# Patient Record
Sex: Male | Born: 1940 | Race: White | Hispanic: No | Marital: Married | Smoking: Former smoker
Health system: Southern US, Community
[De-identification: ages and names within clinical notes are randomized; demographics above are authoritative.]

## PROBLEM LIST (undated history)

## (undated) DIAGNOSIS — Z8546 Personal history of malignant neoplasm of prostate: Secondary | ICD-10-CM

## (undated) DIAGNOSIS — D471 Chronic myeloproliferative disease: Secondary | ICD-10-CM

## (undated) DIAGNOSIS — C801 Malignant (primary) neoplasm, unspecified: Secondary | ICD-10-CM

## (undated) DIAGNOSIS — E785 Hyperlipidemia, unspecified: Secondary | ICD-10-CM

## (undated) HISTORY — DX: Personal history of malignant neoplasm of prostate: Z85.46

## (undated) HISTORY — DX: Chronic myeloproliferative disease: D47.1

## (undated) HISTORY — DX: Hyperlipidemia, unspecified: E78.5

---

## 2015-04-29 HISTORY — PX: PROSTATECTOMY: SHX69

## 2018-10-12 ENCOUNTER — Encounter (HOSPITAL_COMMUNITY): Payer: Self-pay

## 2018-10-12 ENCOUNTER — Other Ambulatory Visit: Payer: Self-pay

## 2018-10-12 ENCOUNTER — Emergency Department (HOSPITAL_COMMUNITY)
Admission: EM | Admit: 2018-10-12 | Discharge: 2018-10-12 | Disposition: A | Discharge status: Home / Self Care | Payer: Medicare PPO | Attending: Emergency Medicine | Admitting: Emergency Medicine

## 2018-10-12 ENCOUNTER — Emergency Department (HOSPITAL_COMMUNITY): Payer: Medicare PPO

## 2018-10-12 DIAGNOSIS — R109 Unspecified abdominal pain: Secondary | ICD-10-CM | POA: Diagnosis not present

## 2018-10-12 DIAGNOSIS — N390 Urinary tract infection, site not specified: Secondary | ICD-10-CM

## 2018-10-12 LAB — URINALYSIS, ROUTINE W REFLEX MICROSCOPIC
Bacteria, UA: NONE SEEN
Bilirubin Urine: NEGATIVE
Glucose, UA: NEGATIVE mg/dL
Ketones, ur: NEGATIVE mg/dL
Nitrite: NEGATIVE
Protein, ur: 30 mg/dL — AB
Specific Gravity, Urine: 1.009 (ref 1.005–1.030)
pH: 6 (ref 5.0–8.0)

## 2018-10-12 MED ORDER — OXYCODONE-ACETAMINOPHEN 5-325 MG PO TABS: 1.0000 | ORAL_TABLET | ORAL | 0 refills | Status: DC | PRN

## 2018-10-12 MED ORDER — CEPHALEXIN 500 MG PO CAPS: 500.0000 mg | ORAL_CAPSULE | Freq: Three times a day (TID) | ORAL | 0 refills | Status: DC

## 2018-10-12 MED ORDER — OXYCODONE-ACETAMINOPHEN 5-325 MG PO TABS
2.0000 | ORAL_TABLET | Freq: Once | ORAL | Status: AC
  Administered 2018-10-12: 2 via ORAL
  Filled 2018-10-12: qty 2

## 2018-10-12 NOTE — ED Provider Notes (Signed)
[facility] EMERGENCY DEPARTMENT Provider Note   CSN: [id number] Arrival date & time: 10/12/18  0701     History   Chief Complaint Chief Complaint  Patient presents with  . Nephrolithiasis    HPI Gordon Love is a 78 y.o. male.     78 year old male recently diagnosed with 4 mm right-sided kidney stone presents with worsening pain.  States he took Toradol a few days ago and felt strange afterwards.  Was pain-free for 2 days but then developed colicky pain this morning.  Denies any hematuria or dysuria.  No fever or chills.  Requesting different for pain.     History reviewed. No pertinent past medical history.  There are no active problems to display for this patient.   History reviewed. No pertinent surgical history.      Home Medications    Prior to Admission medications   Not on File    Family History No family history on file.  Social History Social History   Tobacco Use  . Smoking status: Not on file  Substance Use Topics  . Alcohol use: Not on file  . Drug use: Not on file     Allergies   Patient has no allergy information on record.   Review of Systems Review of Systems  All other systems reviewed and are negative.    Physical Exam Updated Vital Signs BP 136/88   Pulse (!) 106   Temp 98.1 F (36.7 C) (Oral)   Resp 16   Ht 1.702 m (5\' 7" )   Wt 75.3 kg   SpO2 94%   BMI 26.00 kg/m   Physical Exam Vitals signs and nursing note reviewed.  Constitutional:      General: He is not in acute distress.    Appearance: Normal appearance. He is well-developed. He is not toxic-appearing.  HENT:     Head: Normocephalic and atraumatic.  Eyes:     General: Lids are normal.     Conjunctiva/sclera: Conjunctivae normal.     Pupils: Pupils are equal, round, and reactive to light.  Neck:     Musculoskeletal: Normal range of motion and neck supple.     Thyroid: No thyroid mass.     Trachea: No tracheal deviation.   Cardiovascular:     Rate and Rhythm: Normal rate and regular rhythm.     Heart sounds: Normal heart sounds. No murmur. No gallop.   Pulmonary:     Effort: Pulmonary effort is normal. No respiratory distress.     Breath sounds: Normal breath sounds. No stridor. No decreased breath sounds, wheezing, rhonchi or rales.  Abdominal:     General: Bowel sounds are normal. There is no distension.     Palpations: Abdomen is soft.     Tenderness: There is no abdominal tenderness. There is no rebound.  Musculoskeletal: Normal range of motion.        General: No tenderness.  Skin:    General: Skin is warm and dry.     Findings: No abrasion or rash.  Neurological:     Mental Status: He is alert and oriented to person, place, and time.     GCS: GCS eye subscore is 4. GCS verbal subscore is 5. GCS motor subscore is 6.     Cranial Nerves: No cranial nerve deficit.     Sensory: No sensory deficit.  Psychiatric:        Speech: Speech normal.        Behavior: Behavior normal.  ED Treatments / Results  Labs (all labs ordered are listed, but only abnormal results are displayed) Labs Reviewed  URINE CULTURE  URINALYSIS, [place] MICROSCOPIC    EKG    Radiology No results found.  Procedures Procedures (including critical care time)  Medications Ordered in ED Medications  oxyCODONE-acetaminophen (PERCOCET/ROXICET) 5-325 MG per tablet 2 tablet (has no administration in time range)     Initial Impression / Assessment and Plan / ED Course  I have reviewed the triage vital signs and the nursing notes.  Pertinent labs & imaging results that were available during my care of the patient were reviewed by me and considered in my medical decision making (see chart for details).        Patient given 2 Percocets for pain here and feels better.  Urinalysis does show some white blood cells.  He is afebrile here.  Will treat for UTI and he will follow-up with his urologist  Final  Clinical Impressions(s) / ED Diagnoses   Final diagnoses:  None    ED Discharge Orders    None       Lacretia Leigh, MD 10/12/18 [phone]

## 2018-10-12 NOTE — ED Notes (Signed)
Patient drinking water and had a small clear emesis. Stated did not eat or drink anything this morning and had pain medication 2 tabs that might of upset his stomach. Denies nausea now refused nausea medication given crackers tolerated without incident.

## 2018-10-12 NOTE — ED Notes (Signed)
ED Provider at bedside. 

## 2018-10-12 NOTE — ED Notes (Signed)
DaughterRichardean Chimera- [phone]

## 2018-10-12 NOTE — ED Notes (Signed)
Pt given dc instructions and pt verbalizes understanding. Pt left with wife and daughter.

## 2018-10-12 NOTE — Discharge Instructions (Addendum)
Stop taking the Toradol and follow-up with your doctor as needed.  Your kidney stone has made some progression but is still present.

## 2018-10-12 NOTE — ED Triage Notes (Signed)
Pt reports right sided flank pain, went to a hospital in [place], dx with kidney stones, given prescription for ketorolac and stated it "took the life out of me, it drained me, I cant take that medicine". Pt alert, nad noted

## 2018-10-14 LAB — URINE CULTURE: Culture: >=100000 — AB

## 2018-10-15 ENCOUNTER — Telehealth: Payer: Self-pay | Admitting: Emergency Medicine

## 2018-10-15 NOTE — Telephone Encounter (Signed)
Post ED Visit - Positive Culture Follow-up  Culture report reviewed by antimicrobial stewardship pharmacist: [facility] Team []  Elenor Quinones, Pharm.D. []  Heide Guile, Pharm.D., BCPS AQ-ID []  Parks Neptune, Pharm.D., BCPS []  Alycia Rossetti, Pharm.D., BCPS []  [place], [place].D., BCPS, AAHIVP []  Legrand Como, Pharm.D., BCPS, AAHIVP []  Salome Arnt, PharmD, BCPS []  Johnnette Gourd, PharmD, BCPS [x]  Hughes Better, PharmD, BCPS []  Leeroy Cha, PharmD []  Laqueta Linden, PharmD, BCPS []  Albertina Parr, PharmD  [facility] Team []  Leodis Sias, PharmD []  Lindell Spar, PharmD []  Royetta Asal, PharmD []  Graylin Shiver, Rph []  Rema Fendt) Glennon Mac, PharmD []  Arlyn Dunning, PharmD []  Netta Cedars, PharmD []  Dia Sitter, PharmD []  Leone Haven, PharmD []  Gretta Arab, PharmD []  Theodis Shove, PharmD []  Peggyann Juba, PharmD []  Reuel Boom, PharmD   Positive urine culture Treated with Cephalexin, organism sensitive to the same and no further patient follow-up is required at this time.  [place] Jahron Hunsinger 10/15/2018, 9:04 AM

## 2019-10-21 IMAGING — CT CT RENAL STONE PROTOCOL
2 of 4 series · 16 of 46 positions shown, 18 images · non-contrast
Comparison: 10/09/2018

CLINICAL DATA: Rule out stones, flank pain

EXAM:
CT ABDOMEN AND PELVIS WITHOUT CONTRAST
TECHNIQUE: Multidetector CT imaging of the abdomen and pelvis was performed
following the standard protocol without IV contrast.

[Series 3: renal stone 5.0 · axial · 0.83mm/px · z∈[+886,+1376]mm · 13 of 108 slices shown, 15 images]
[im 5/108  soft-tissue]
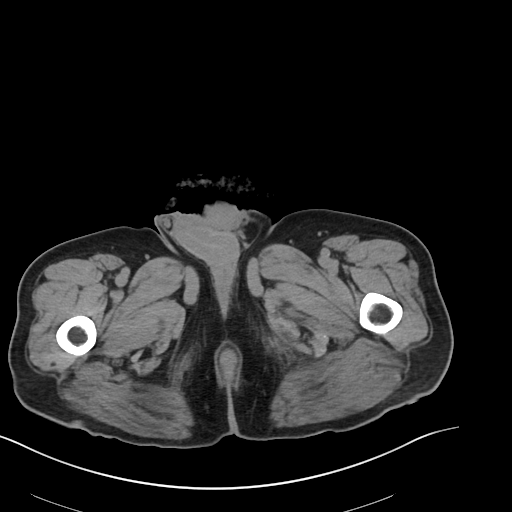
[im 5/108  bone]
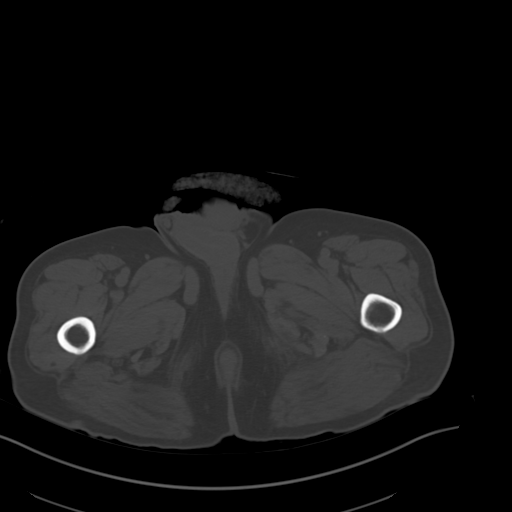
[im 14/108  soft-tissue]
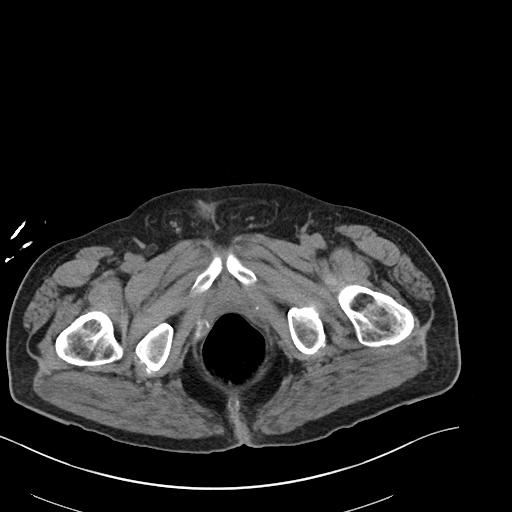
[im 24/108  soft-tissue]
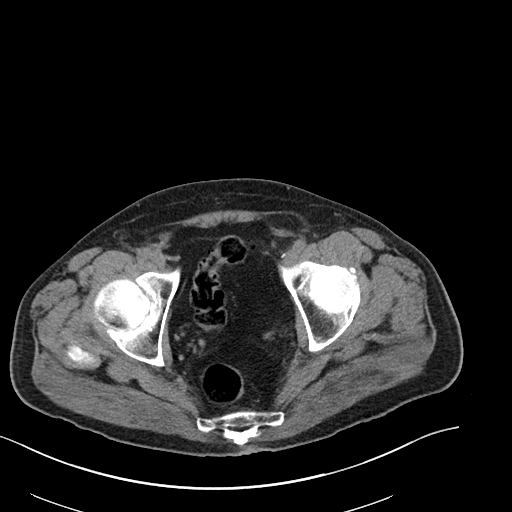
[im 28/108  soft-tissue]
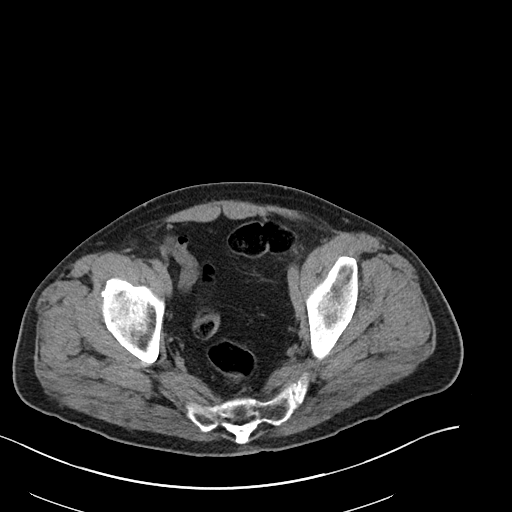
[im 38/108  soft-tissue]
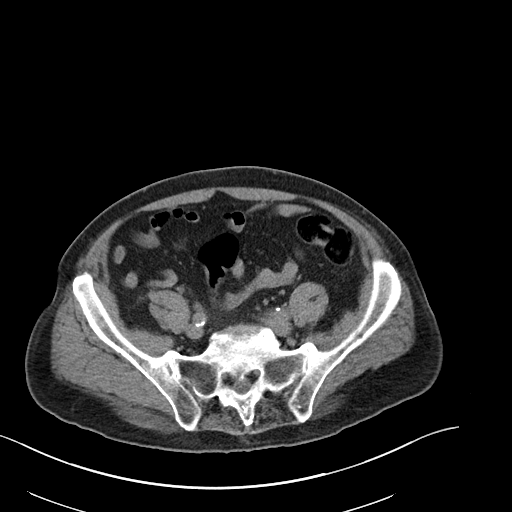
[im 47/108  soft-tissue]
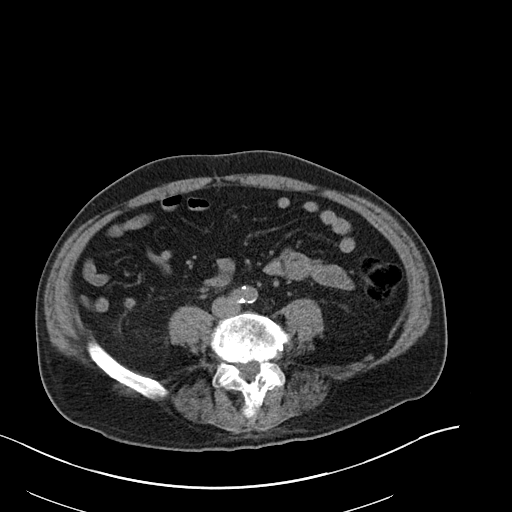
[im 56/108  soft-tissue]
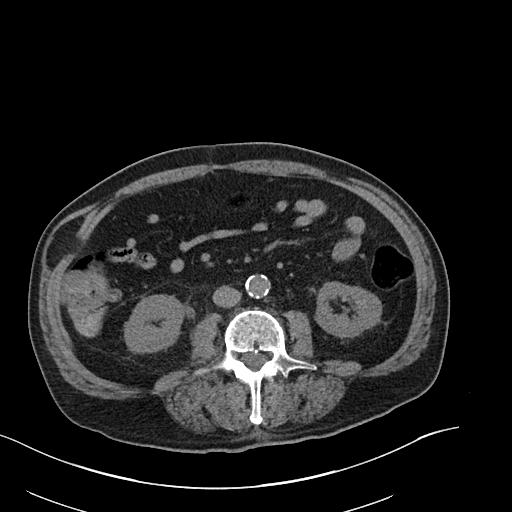
[im 61/108  soft-tissue]
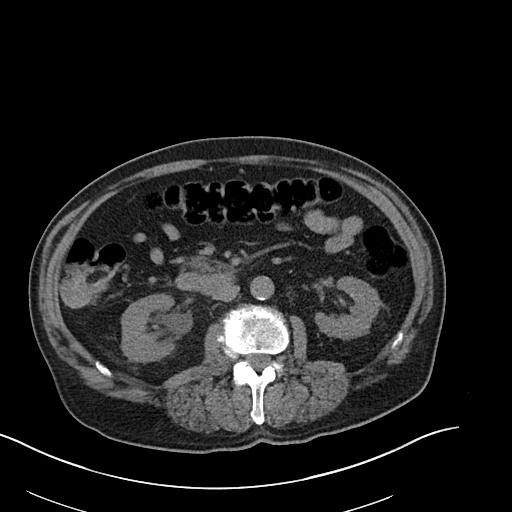
[im 70/108  soft-tissue]
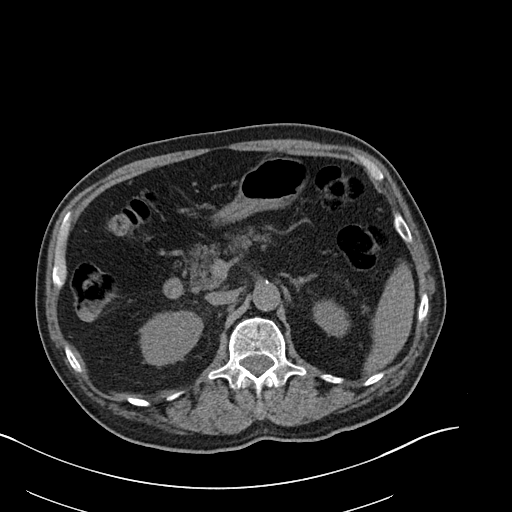
[im 70/108  bone]
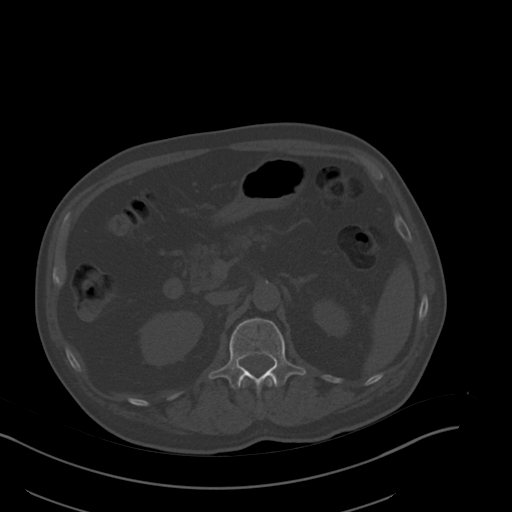
[im 80/108  soft-tissue]
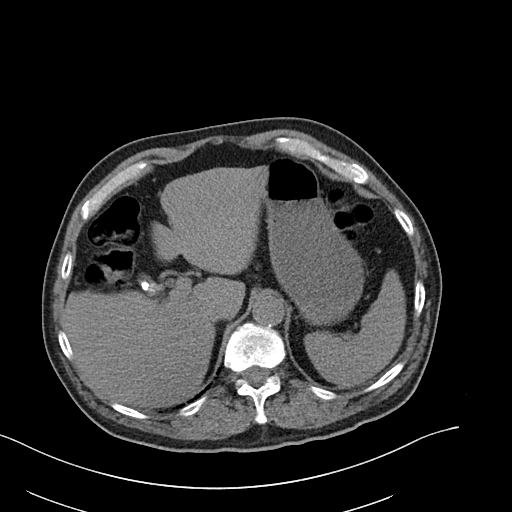
[im 84/108  soft-tissue]
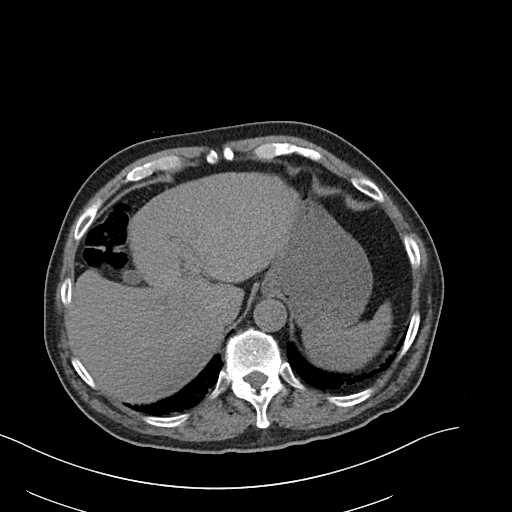
[im 94/108  soft-tissue]
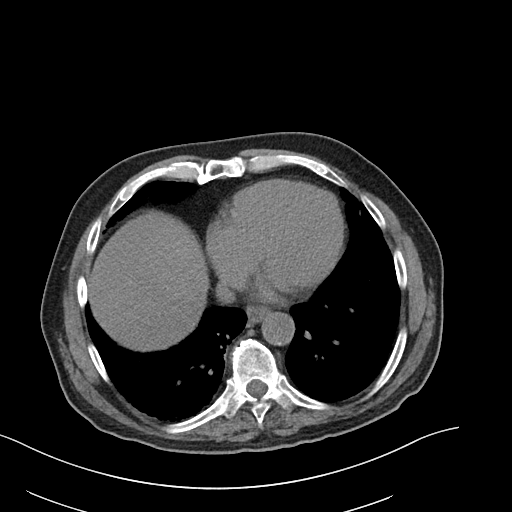
[im 103/108  soft-tissue]
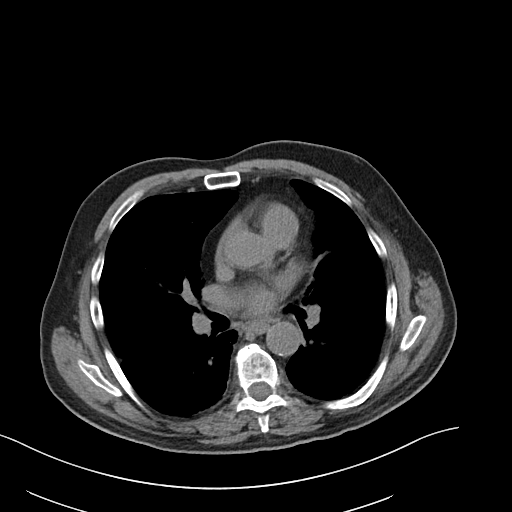

[Series 5: renal stone 3.0 cor · coronal · 1.16mm/px · 3 of 129 slices shown]
[im 43/129  soft-tissue]
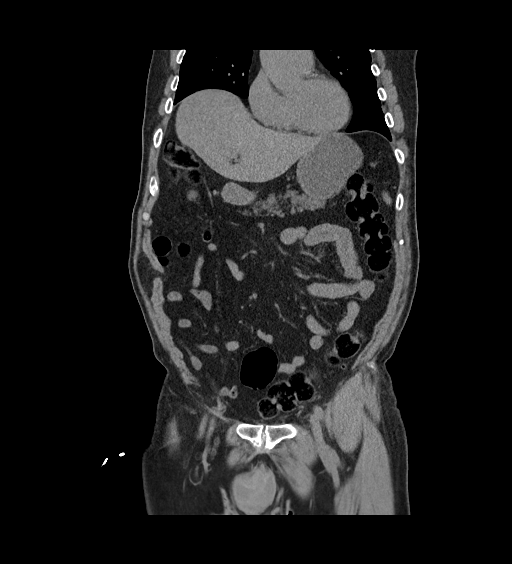
[im 57/129  soft-tissue]
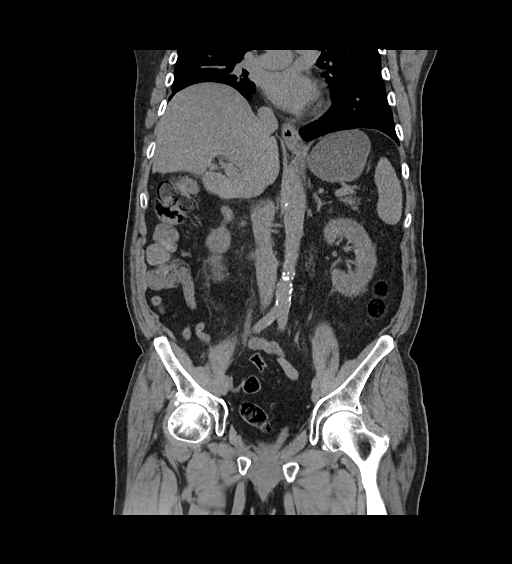
[im 72/129  soft-tissue]
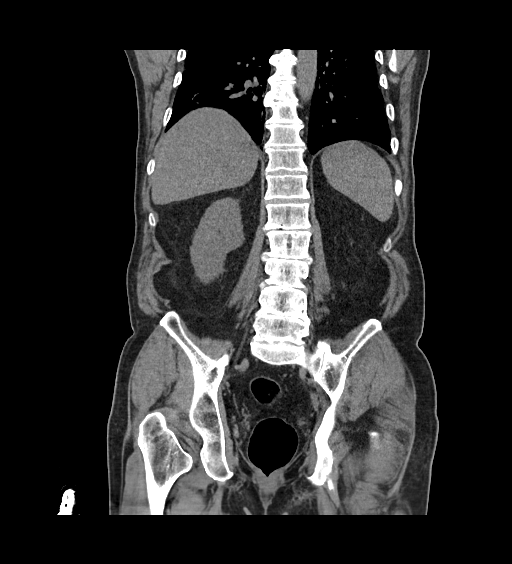

[16 of 46 positions shown; findings below may reference images not displayed]

FINDINGS: Lower chest: No acute abnormality. Mild bibasilar scarring and/or
atelectasis.

Hepatobiliary: No solid liver abnormality is seen. Contracted
gallbladder containing multiple small gallstones. No gallbladder
wall thickening, or biliary dilatation.

Pancreas: Unremarkable. No pancreatic ductal dilatation or
surrounding inflammatory changes.

Spleen: Normal in size without significant abnormality.

Adrenals/Urinary Tract: Adrenal glands are unremarkable. A 4 mm
calculus in the proximal right ureter has migrated to a slightly
more distal position, with unchanged, mild right hydronephrosis and
proximal hydroureter. There are no additional urinary tract calculi
appreciated. Bladder is decompressed and unremarkable.

Stomach/Bowel: Stomach is within normal limits. Appendix appears
normal. No evidence of bowel wall thickening, distention, or
inflammatory changes.

Vascular/Lymphatic: Aortic atherosclerosis. No enlarged abdominal or
pelvic lymph nodes.

Reproductive: Probable prostatectomy.

Other: Small, fat containing left inguinal hernia. No abdominopelvic
ascites.

Musculoskeletal: No acute or significant osseous findings.
IMPRESSION: 1. A 4 mm calculus in the proximal right ureter has migrated to a
slightly more distal position, with unchanged, mild right
hydronephrosis and proximal hydroureter. There are no additional
urinary tract calculi appreciated.

2. Chronic, incidental, and postoperative findings as detailed
above.

## 2022-02-12 ENCOUNTER — Telehealth: Payer: Self-pay | Admitting: Hematology and Oncology

## 2022-02-12 NOTE — Telephone Encounter (Signed)
Scheduled appt per 10/17 referral. Pt is aware of appt date and time. Pt is aware to arrive 15 mins prior to appt time and to bring and updated insurance card. Pt is aware of appt location.   

## 2022-03-03 ENCOUNTER — Ambulatory Visit (HOSPITAL_COMMUNITY)
Admission: RE | Admit: 2022-03-03 | Discharge: 2022-03-03 | Disposition: A | Discharge status: Home / Self Care | Payer: Medicare PPO | Source: Ambulatory Visit | Attending: Hematology and Oncology | Admitting: Hematology and Oncology

## 2022-03-03 ENCOUNTER — Inpatient Hospital Stay: Payer: Medicare PPO | Attending: Hematology and Oncology | Admitting: Hematology and Oncology

## 2022-03-03 ENCOUNTER — Other Ambulatory Visit: Payer: Self-pay

## 2022-03-03 ENCOUNTER — Inpatient Hospital Stay: Payer: Medicare PPO

## 2022-03-03 VITALS — BP 129/74 | HR 78 | Temp 97.4°F | Resp 18 | Ht 67.0 in | Wt 165.6 lb

## 2022-03-03 DIAGNOSIS — D473 Essential (hemorrhagic) thrombocythemia: Secondary | ICD-10-CM

## 2022-03-03 DIAGNOSIS — Z8546 Personal history of malignant neoplasm of prostate: Secondary | ICD-10-CM | POA: Diagnosis not present

## 2022-03-03 DIAGNOSIS — Z801 Family history of malignant neoplasm of trachea, bronchus and lung: Secondary | ICD-10-CM | POA: Insufficient documentation

## 2022-03-03 DIAGNOSIS — D72829 Elevated white blood cell count, unspecified: Secondary | ICD-10-CM | POA: Insufficient documentation

## 2022-03-03 DIAGNOSIS — N182 Chronic kidney disease, stage 2 (mild): Secondary | ICD-10-CM | POA: Insufficient documentation

## 2022-03-03 DIAGNOSIS — D75839 Thrombocytosis, unspecified: Secondary | ICD-10-CM | POA: Insufficient documentation

## 2022-03-03 DIAGNOSIS — Z803 Family history of malignant neoplasm of breast: Secondary | ICD-10-CM | POA: Diagnosis not present

## 2022-03-03 DIAGNOSIS — J849 Interstitial pulmonary disease, unspecified: Secondary | ICD-10-CM | POA: Insufficient documentation

## 2022-03-03 DIAGNOSIS — R7989 Other specified abnormal findings of blood chemistry: Secondary | ICD-10-CM | POA: Insufficient documentation

## 2022-03-03 DIAGNOSIS — Z87891 Personal history of nicotine dependence: Secondary | ICD-10-CM | POA: Insufficient documentation

## 2022-03-03 LAB — CBC WITH DIFFERENTIAL (CANCER CENTER ONLY)
Abs Immature Granulocytes: 0.08 10*3/uL — ABNORMAL HIGH (ref 0.00–0.07)
Basophils Absolute: 0.2 10*3/uL — ABNORMAL HIGH (ref 0.0–0.1)
Basophils Relative: 1 %
Eosinophils Absolute: 0.4 10*3/uL (ref 0.0–0.5)
Eosinophils Relative: 3 %
HCT: 45.3 % (ref 39.0–52.0)
Hemoglobin: 15 g/dL (ref 13.0–17.0)
Immature Granulocytes: 1 %
Lymphocytes Relative: 12 %
Lymphs Abs: 1.3 10*3/uL (ref 0.7–4.0)
MCH: 34.4 pg — ABNORMAL HIGH (ref 26.0–34.0)
MCHC: 33.1 g/dL (ref 30.0–36.0)
MCV: 103.9 fL — ABNORMAL HIGH (ref 80.0–100.0)
Monocytes Absolute: 1.2 10*3/uL — ABNORMAL HIGH (ref 0.1–1.0)
Monocytes Relative: 11 %
Neutro Abs: 8.1 10*3/uL — ABNORMAL HIGH (ref 1.7–7.7)
Neutrophils Relative %: 72 %
Platelet Count: 689 10*3/uL — ABNORMAL HIGH (ref 150–400)
RBC: 4.36 MIL/uL (ref 4.22–5.81)
RDW: 15 % (ref 11.5–15.5)
WBC Count: 11.2 10*3/uL — ABNORMAL HIGH (ref 4.0–10.5)
nRBC: 0 % (ref 0.0–0.2)

## 2022-03-03 LAB — CMP (CANCER CENTER ONLY)
ALT: 48 U/L — ABNORMAL HIGH (ref 0–44)
AST: 27 U/L (ref 15–41)
Albumin: 4.3 g/dL (ref 3.5–5.0)
Alkaline Phosphatase: 75 U/L (ref 38–126)
Anion gap: 5 (ref 5–15)
BUN: 21 mg/dL (ref 8–23)
CO2: 31 mmol/L (ref 22–32)
Calcium: 10.4 mg/dL — ABNORMAL HIGH (ref 8.9–10.3)
Chloride: 104 mmol/L (ref 98–111)
Creatinine: 1.31 mg/dL — ABNORMAL HIGH (ref 0.61–1.24)
GFR, Estimated: 55 mL/min — ABNORMAL LOW (ref >60)
Glucose, Bld: 100 mg/dL — ABNORMAL HIGH (ref 70–99)
Potassium: 5.2 mmol/L — ABNORMAL HIGH (ref 3.5–5.1)
Sodium: 140 mmol/L (ref 135–145)
Total Bilirubin: 0.4 mg/dL (ref 0.3–1.2)
Total Protein: 7.8 g/dL (ref 6.5–8.1)

## 2022-03-03 LAB — SEDIMENTATION RATE: Sed Rate: 20 mm/hr — ABNORMAL HIGH (ref 0–16)

## 2022-03-03 LAB — URIC ACID: Uric Acid, Serum: 4.4 mg/dL (ref 3.7–8.6)

## 2022-03-03 LAB — ABO/RH: ABO/RH(D): O POS

## 2022-03-04 ENCOUNTER — Encounter: Payer: Self-pay | Admitting: Hematology and Oncology

## 2022-03-04 ENCOUNTER — Telehealth: Payer: Self-pay

## 2022-03-04 DIAGNOSIS — N182 Chronic kidney disease, stage 2 (mild): Secondary | ICD-10-CM | POA: Insufficient documentation

## 2022-03-04 DIAGNOSIS — Z8546 Personal history of malignant neoplasm of prostate: Secondary | ICD-10-CM | POA: Insufficient documentation

## 2022-03-04 NOTE — Assessment & Plan Note (Signed)
His lung examination revealed coarse bilateral lung crackles I suspect the patient indeed has interstitial lung disease, could be due to chemical exposure from his previous occupation I recommend referral to see pulmonologist who specializes in interstitial lung disease for further management

## 2022-03-04 NOTE — Progress Notes (Signed)
[facility] CONSULT NOTE  Patient Care Team: Pomposini, [name], MD as PCP - General (Internal Medicine)  ASSESSMENT & PLAN:  Essential thrombocytosis ([place]) Unfortunately, we do not have outside records to collaborate his history My best educated guess is that the patient probably have essential thrombocytosis I have asked the patient to sign paperwork to get permission to obtain outside records Repeat CBC shows significant leukocytosis and thrombocytosis that would definitely increase his risk of thrombotic event I recommend we increase the dose of hydroxyurea to 1000 mg daily along with aspirin therapy I plan to see him back in 2 weeks for further follow-up  Interstitial lung disease ([place]) His lung examination revealed coarse bilateral lung crackles I suspect the patient indeed has interstitial lung disease, could be due to chemical exposure from his previous occupation I recommend referral to see pulmonologist who specializes in interstitial lung disease for further management   Renal failure, chronic, stage 2 (mild) His blood work revealed some mild hypercalcemia and elevated serum creatinine, consistent with dehydration For some reason, he did not drink any liquids prior to blood draw I recommend the patient to stay hydrated prior to next blood draw  History of prostate cancer Patient has regular follow-up with urologist and according to the patient he remained in remission  Orders Placed This Encounter  Procedures   DG Chest 2 View    Standing Status:   Future    Number of Occurrences:   1    Standing Expiration Date:   03/04/2023    Order Specific Question:   Reason for exam:    Answer:   interstitial lung disease    Order Specific Question:   Preferred imaging location?    Answer:   [facility]   CBC with Differential ([facility] Only)    Standing Status:   Future    Number of Occurrences:   1    Standing Expiration Date:   03/04/2023    Sedimentation rate    Standing Status:   Future    Number of Occurrences:   1    Standing Expiration Date:   03/04/2023   JAK2 (including V617F and Exon 12), MPL, and CALR-Next Generation Sequencing    Standing Status:   Future    Number of Occurrences:   1    Standing Expiration Date:   03/03/2023   CMP ([facility] only)    Standing Status:   Future    Number of Occurrences:   1    Standing Expiration Date:   03/04/2023   BCR ABL1 FISH ([facility])    Standing Status:   Future    Number of Occurrences:   1    Standing Expiration Date:   03/04/2023   Uric acid    Standing Status:   Future    Number of Occurrences:   1    Standing Expiration Date:   03/04/2023   Ambulatory referral to Pulmonology    Referral Priority:   Routine    Referral Type:   Consultation    Referral Reason:   Specialty Services Required    Requested Specialty:   Pulmonary Disease    Number of Visits Requested:   1   ABO/Rh    Standing Status:   Future    Number of Occurrences:   1    Standing Expiration Date:   03/04/2023    The total time spent in the appointment was 60 minutes encounter with patients including review of  chart and various tests results, discussions about plan of care and coordination of care plan   All questions were answered. The patient knows to call the clinic with any problems, questions or concerns. No barriers to learning was detected.  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 male is here because of diagnosis of unknown blood disorder He is here accompanied by his daughter, Tylan and his wife The patient have remote history of prostate cancer status post surgery in 2017 and was told he was cured without adjuvant treatment.  He has regular PSA monitoring through his local urologist A year ago, he was  noted to have elevated platelet count close to 800,000 The patient was referred to see a local hematologist in [place].  According to him, his hematologist is a locum physician.  The patient undergone a series of blood work but never had bone marrow biopsy done He was never told the diagnosis of his condition.  He was prescribed hydroxyurea Initially, he was started on 500 mg daily and the dose was subsequently increased to 1000 mg.  According to the patient, he has been to see his hematologist for several visits but each visits were very brief and he was never told the results of his blood work and was not provided full explanation as to why he needs to take hydroxyurea He was also recommended to take aspirin but due to lack of explanation, he has not been compliant taking that Prior to the diagnosis of myeloproliferative disorder, he denies abnormal night sweats, skin itching or others.  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 According to documentation of his chart, he was diagnosed with interstitial lung disease from abnormal imaging studies but he was never told about this history He has occasional cough but denies history of hemoptysis or shortness of breath  MEDICAL HISTORY:  Past Medical History:  Diagnosis Date   History of prostate cancer    Hyperlipidemia    Myeloproliferative disorder ([place])     SURGICAL HISTORY: Past Surgical History:  Procedure Laterality Date   PROSTATECTOMY  2017    SOCIAL HISTORY: Social History   Socioeconomic History   Marital status: Married    Spouse name: Not on file   Number of children: 1   Years of education: Not on file   Highest education level: Not on file  Occupational History   Not on file  Tobacco Use   Smoking status: Former    Types: Cigarettes    Quit date: 1985    Years since quitting: 38.8   Smokeless tobacco: Never  Substance and Sexual  Activity   Alcohol use: Not Currently   Drug use: Never   Sexual activity: Not on file  Other Topics Concern   Not on file  Social History Narrative   Not on file   Social Determinants of Health   Financial Resource Strain: Not on file  Food Insecurity: Not on file  Transportation Needs: Not on file  Physical Activity: Not on file  Stress: Not on file  Social Connections: Not on file  Intimate Partner Violence: Not on file    FAMILY HISTORY: Family History  Problem Relation Age of Onset   Cancer Mother        unknown ca   Cancer Father  lung ca   Cancer Daughter        breast ca    ALLERGIES:  has No Known Allergies.  MEDICATIONS:  Current Outpatient Medications  Medication Sig Dispense Refill   aspirin EC 81 MG tablet Take 81 mg by mouth daily. Swallow whole.     atorvastatin (LIPITOR) 20 MG tablet Take 1 tablet by mouth daily.     hydroxyurea (HYDREA) 500 MG capsule Take 1,000 mg by mouth daily. May take with food to minimize GI side effects.     No current facility-administered medications for this visit.    REVIEW OF SYSTEMS:   Constitutional: Denies fevers, chills or abnormal night sweats Eyes: Denies blurriness of vision, double vision or watery eyes Ears, nose, mouth, throat, and face: Denies mucositis or sore throat Respiratory: Denies cough, dyspnea or wheezes Cardiovascular: Denies palpitation, chest discomfort or lower extremity swelling Gastrointestinal:  Denies nausea, heartburn or change in bowel habits Skin: Denies abnormal skin rashes Lymphatics: Denies new lymphadenopathy or easy bruising Neurological:Denies numbness, tingling or new weaknesses Behavioral/Psych: Mood is stable, no new changes  All other systems were reviewed with the patient and are negative.  PHYSICAL EXAMINATION: ECOG PERFORMANCE STATUS: 1 - Symptomatic but completely ambulatory  Vitals:   03/03/22 1203  BP: 129/74  Pulse: 78  Resp: 18  Temp: (!) 97.4 F (36.3  C)  SpO2: 96%   Filed Weights   03/03/22 1203  Weight: 165 lb 9.6 oz (75.1 kg)    GENERAL:alert, no distress and comfortable SKIN: skin color, texture, turgor are normal, no rashes or significant lesions EYES: normal, conjunctiva are pink and non-injected, sclera clear OROPHARYNX:no exudate, no erythema and lips, buccal mucosa, and tongue normal  NECK: supple, thyroid normal size, non-tender, without nodularity LYMPH:  no palpable lymphadenopathy in the cervical, axillary or inguinal LUNGS: He has bilateral crackles on both lung bases on exam HEART: regular rate & rhythm and no murmurs and no lower extremity edema ABDOMEN:abdomen soft, non-tender and normal bowel sounds.  No palpable splenomegaly Musculoskeletal:no cyanosis of digits and no clubbing  PSYCH: alert & oriented x 3 with fluent speech NEURO: no focal motor/sensory deficits  LABORATORY DATA:  I have reviewed the data as listed Lab Results  Component Value Date   WBC 11.2 (H) 03/03/2022   HGB 15.0 03/03/2022   HCT 45.3 03/03/2022   MCV 103.9 (H) 03/03/2022   PLT 689 (H) 03/03/2022   Recent Labs    03/03/22 1248  NA 140  K 5.2*  CL 104  CO2 31  GLUCOSE 100*  BUN 21  CREATININE 1.31*  CALCIUM 10.4*  GFRNONAA 55*  PROT 7.8  ALBUMIN 4.3  AST 27  ALT 48*  ALKPHOS 75  BILITOT 0.4    RADIOGRAPHIC STUDIES: I have personally reviewed the chest x-ray.

## 2022-03-04 NOTE — Assessment & Plan Note (Signed)
Patient has regular follow-up with urologist and according to the patient he remained in remission

## 2022-03-04 NOTE — Assessment & Plan Note (Signed)
His blood work revealed some mild hypercalcemia and elevated serum creatinine, consistent with dehydration For some reason, he did not drink any liquids prior to blood draw I recommend the patient to stay hydrated prior to next blood draw

## 2022-03-04 NOTE — Telephone Encounter (Signed)
[place] and faxed request with release of PHI from Mr. Quinto. Faxed to [phone], received fax confirmation.

## 2022-03-04 NOTE — Assessment & Plan Note (Addendum)
Unfortunately, we do not have outside records to collaborate his history My best educated guess is that the patient probably have essential thrombocytosis I have asked the patient to sign paperwork to get permission to obtain outside records Repeat CBC shows significant leukocytosis and thrombocytosis that would definitely increase his risk of thrombotic event I recommend we increase the dose of hydroxyurea to 1000 mg daily along with aspirin therapy I plan to see him back in 2 weeks for further follow-up

## 2022-03-14 ENCOUNTER — Other Ambulatory Visit: Payer: Self-pay | Admitting: Hematology and Oncology

## 2022-03-14 DIAGNOSIS — D473 Essential (hemorrhagic) thrombocythemia: Secondary | ICD-10-CM

## 2022-03-14 DIAGNOSIS — N182 Chronic kidney disease, stage 2 (mild): Secondary | ICD-10-CM

## 2022-03-17 ENCOUNTER — Inpatient Hospital Stay: Payer: Medicare PPO

## 2022-03-17 ENCOUNTER — Other Ambulatory Visit: Payer: Self-pay

## 2022-03-17 ENCOUNTER — Telehealth: Payer: Medicare PPO | Admitting: Hematology and Oncology

## 2022-03-17 ENCOUNTER — Inpatient Hospital Stay: Payer: Medicare PPO | Admitting: Hematology and Oncology

## 2022-03-17 ENCOUNTER — Encounter: Payer: Self-pay | Admitting: Hematology and Oncology

## 2022-03-17 VITALS — BP 130/61 | HR 69 | Temp 97.4°F | Resp 18 | Ht 67.0 in | Wt 169.0 lb

## 2022-03-17 DIAGNOSIS — J849 Interstitial pulmonary disease, unspecified: Secondary | ICD-10-CM

## 2022-03-17 DIAGNOSIS — D473 Essential (hemorrhagic) thrombocythemia: Secondary | ICD-10-CM | POA: Diagnosis not present

## 2022-03-17 DIAGNOSIS — N182 Chronic kidney disease, stage 2 (mild): Secondary | ICD-10-CM

## 2022-03-17 LAB — BASIC METABOLIC PANEL - CANCER CENTER ONLY
Anion gap: 5 (ref 5–15)
BUN: 17 mg/dL (ref 8–23)
CO2: 31 mmol/L (ref 22–32)
Calcium: 10.2 mg/dL (ref 8.9–10.3)
Chloride: 103 mmol/L (ref 98–111)
Creatinine: 0.91 mg/dL (ref 0.61–1.24)
GFR, Estimated: >60 mL/min (ref >60)
Glucose, Bld: 101 mg/dL — ABNORMAL HIGH (ref 70–99)
Potassium: 5.4 mmol/L — ABNORMAL HIGH (ref 3.5–5.1)
Sodium: 139 mmol/L (ref 135–145)

## 2022-03-17 LAB — CBC WITH DIFFERENTIAL/PLATELET
Abs Immature Granulocytes: 0.09 10*3/uL — ABNORMAL HIGH (ref 0.00–0.07)
Basophils Absolute: 0.2 10*3/uL — ABNORMAL HIGH (ref 0.0–0.1)
Basophils Relative: 2 %
Eosinophils Absolute: 0.4 10*3/uL (ref 0.0–0.5)
Eosinophils Relative: 4 %
HCT: 43.6 % (ref 39.0–52.0)
Hemoglobin: 14.4 g/dL (ref 13.0–17.0)
Immature Granulocytes: 1 %
Lymphocytes Relative: 15 %
Lymphs Abs: 1.4 10*3/uL (ref 0.7–4.0)
MCH: 34.5 pg — ABNORMAL HIGH (ref 26.0–34.0)
MCHC: 33 g/dL (ref 30.0–36.0)
MCV: 104.6 fL — ABNORMAL HIGH (ref 80.0–100.0)
Monocytes Absolute: 0.9 10*3/uL (ref 0.1–1.0)
Monocytes Relative: 10 %
Neutro Abs: 6.4 10*3/uL (ref 1.7–7.7)
Neutrophils Relative %: 68 %
Platelets: 644 10*3/uL — ABNORMAL HIGH (ref 150–400)
RBC: 4.17 MIL/uL — ABNORMAL LOW (ref 4.22–5.81)
RDW: 15.3 % (ref 11.5–15.5)
WBC: 9.4 10*3/uL (ref 4.0–10.5)
nRBC: 0 % (ref 0.0–0.2)

## 2022-03-18 ENCOUNTER — Encounter: Payer: Self-pay | Admitting: Hematology and Oncology

## 2022-03-18 NOTE — Assessment & Plan Note (Signed)
His renal function has improved We discussed importance of hydration

## 2022-03-18 NOTE — Assessment & Plan Note (Signed)
He has been referred to see pulmonologist to evaluate the cause of his interstitial lung disease

## 2022-03-18 NOTE — Progress Notes (Signed)
[place] OFFICE PROGRESS NOTE  Patient Care Team: Pomposini, [name], MD as PCP - General (Internal Medicine)  ASSESSMENT & PLAN:  Essential thrombocytosis ([place]) I have reviewed outside records with the patient The patient has a positive for MPL mutation and overall, his diagnosis is essential thrombocytosis I recommend he continues taking hydroxyurea 1000 mg daily along with aspirin I plan to see him again in a month for further follow-up  Interstitial lung disease ([place]) He has been referred to see pulmonologist to evaluate the cause of his interstitial lung disease  Renal failure, chronic, stage 2 (mild) His renal function has improved We discussed importance of hydration  No orders of the defined types were placed in this encounter.   All questions were answered. The patient knows to call the clinic with any problems, questions or concerns. The total time spent in the appointment was 20 minutes encounter with patients including review of chart and various tests results, discussions about plan of care and coordination of care plan   Heath Lark, MD 03/18/2022 10:52 AM  INTERVAL HISTORY: Please see below for problem oriented charting. he returns for treatment follow-up with family I reviewed his outside records So far, he tolerated Hydrea well We discussed referral to see pulmonologist  REVIEW OF SYSTEMS:   Constitutional: Denies fevers, chills or abnormal weight loss Eyes: Denies blurriness of vision Ears, nose, mouth, throat, and face: Denies mucositis or sore throat Respiratory: Denies cough, dyspnea or wheezes Cardiovascular: Denies palpitation, chest discomfort or lower extremity swelling Gastrointestinal:  Denies nausea, heartburn or change in bowel habits Skin: Denies abnormal skin rashes Lymphatics: Denies new lymphadenopathy or easy bruising Neurological:Denies numbness, tingling or new weaknesses Behavioral/Psych: Mood is stable, no new  changes  All other systems were reviewed with the patient and are negative.  I have reviewed the past medical history, past surgical history, social history and family history with the patient and they are unchanged from previous note.  ALLERGIES:  has No Known Allergies.  MEDICATIONS:  Current Outpatient Medications  Medication Sig Dispense Refill   aspirin EC 81 MG tablet Take 81 mg by mouth daily. Swallow whole.     atorvastatin (LIPITOR) 20 MG tablet Take 1 tablet by mouth daily.     hydroxyurea (HYDREA) 500 MG capsule Take 1,000 mg by mouth daily. May take with food to minimize GI side effects.     No current facility-administered medications for this visit.    SUMMARY OF ONCOLOGIC HISTORY: He has been diagnosed with MPL mutation/essential thrombocytosis from [place] He is here accompanied by his daughter, Romin and his wife The patient have remote history of prostate cancer status post surgery in 2017 and was told he was cured without adjuvant treatment.  He has regular PSA monitoring through his local urologist A year ago, he was noted to have elevated platelet count close to 800,000 The patient was referred to see a local hematologist in [place].  According to him, his hematologist is a locum physician.  The patient undergone a series of blood work but never had bone marrow biopsy done He was never told the diagnosis of his condition.  He was prescribed hydroxyurea Initially, he was started on 500 mg daily and the dose was subsequently increased to 1000 mg.  According to the patient, he has been to see his hematologist for several visits but each visits were very brief and he was never told the results of his blood work and was not provided full explanation  as to why he needs to take hydroxyurea He was also recommended to take aspirin but due to lack of explanation, he has not been compliant taking that Prior to the diagnosis of myeloproliferative disorder, he denies abnormal  night sweats, skin itching or others.  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 According to documentation of his chart, he was diagnosed with interstitial lung disease from abnormal imaging studies but he was never told about this history He has occasional cough but denies history of hemoptysis or shortness of breath The patient was recommended to continue taking hydroxyurea at 1000 mg daily along with aspirin  PHYSICAL EXAMINATION: ECOG PERFORMANCE STATUS: 0 - Asymptomatic  Vitals:   03/17/22 1423  BP: 130/61  Pulse: 69  Resp: 18  Temp: (!) 97.4 F (36.3 C)  SpO2: 99%   Filed Weights   03/17/22 1423  Weight: 169 lb (76.7 kg)    GENERAL:alert, no distress and comfortable NEURO: alert & oriented x 3 with fluent speech, no focal motor/sensory deficits  LABORATORY DATA:  I have reviewed the data as listed    Component Value Date/Time   NA 139 03/17/2022 1355   K 5.4 (H) 03/17/2022 1355   CL 103 03/17/2022 1355   CO2 31 03/17/2022 1355   GLUCOSE 101 (H) 03/17/2022 1355   BUN 17 03/17/2022 1355   CREATININE 0.91 03/17/2022 1355   CALCIUM 10.2 03/17/2022 1355   PROT 7.8 03/03/2022 1248   ALBUMIN 4.3 03/03/2022 1248   AST 27 03/03/2022 1248   ALT 48 (H) 03/03/2022 1248   ALKPHOS 75 03/03/2022 1248   BILITOT 0.4 03/03/2022 1248   GFRNONAA >60 03/17/2022 1355    No results found for: "SPEP", "UPEP"  Lab Results  Component Value Date   WBC 9.4 03/17/2022   NEUTROABS 6.4 03/17/2022   HGB 14.4 03/17/2022   HCT 43.6 03/17/2022   MCV 104.6 (H) 03/17/2022   PLT 644 (H) 03/17/2022      Chemistry      Component Value Date/Time   NA 139 03/17/2022 1355   K 5.4 (H) 03/17/2022 1355   CL 103 03/17/2022 1355   CO2 31 03/17/2022 1355   BUN 17 03/17/2022 1355   CREATININE 0.91 03/17/2022 1355      Component Value Date/Time   CALCIUM 10.2 03/17/2022 1355   ALKPHOS 75 03/03/2022 1248   AST  27 03/03/2022 1248   ALT 48 (H) 03/03/2022 1248   BILITOT 0.4 03/03/2022 1248       RADIOGRAPHIC STUDIES: I have personally reviewed the radiological images as listed and agreed with the findings in the report. DG Chest 2 View  Result Date: 03/07/2022 CLINICAL DATA:  81-year-old presents with history of interstitial lung disease. EXAM: CHEST - 2 VIEW COMPARISON:  November 01, 2018. FINDINGS: There is evidence of increased interstitial markings with slight asymmetry favoring the lower lobes, LEFT greater than RIGHT. There is no lobar consolidative process. No sign of pleural effusion. Cardiomediastinal contours and hilar structures are normal. On limited assessment no acute skeletal findings. IMPRESSION: Chronic interstitial prominence which is potentially increased since previous imaging though prior imaging was obtained in portable AP projection. Findings raise the question of mild basilar predominant interstitial thickening/lung disease. Electronically Signed   By: Geoffrey  Wile M.D.   On: 03/07/2022 13:40   

## 2022-03-18 NOTE — Assessment & Plan Note (Signed)
I have reviewed outside records with the patient The patient has a positive for MPL mutation and overall, his diagnosis is essential thrombocytosis I recommend he continues taking hydroxyurea 1000 mg daily along with aspirin I plan to see him again in a month for further follow-up

## 2022-04-01 ENCOUNTER — Ambulatory Visit: Payer: Medicare PPO | Admitting: Pulmonary Disease

## 2022-04-01 ENCOUNTER — Encounter: Payer: Self-pay | Admitting: Pulmonary Disease

## 2022-04-01 VITALS — BP 136/70 | HR 77 | Temp 97.6°F | Ht 67.0 in | Wt 164.4 lb

## 2022-04-01 DIAGNOSIS — J849 Interstitial pulmonary disease, unspecified: Secondary | ICD-10-CM | POA: Diagnosis not present

## 2022-04-01 NOTE — Patient Instructions (Signed)
Will check some labs for further evaluation of the suspected changes on chest x-ray We will get high-res CT, PFTs and follow-up in 3 months

## 2022-04-01 NOTE — Progress Notes (Addendum)
Gordon Love    [id number]    1941/02/17  Primary Care Physician:Pomposini, [name], MD  Referring Physician: Clinton Quant, MD No address on file  Chief complaint: Consult for abnormal x-ray  HPI: 81 y.o. who  has a past medical history of History of prostate cancer, Hyperlipidemia, and Myeloproliferative disorder ([place]).   He follows with Dr. Alvy Bimler search for essential thrombocytosis, MPL mutation for which he is on hydroxyurea.  Chest crackles were noted on examination and subsequent chest x-ray showed possible interstitial changes.  He has been referred here for further evaluation  States that his breathing is doing fine with no issues.  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  No feather pillows or comforters ILD questionnaire 04/01/2022- Negative Smoking history: 10-pack-year smoker.  Quit in 1980 Travel history: Lives in [place].  No significant travel Relevant family history: No family history of lung disease.  Outpatient Encounter Medications as of 04/01/2022  Medication Sig   aspirin EC 81 MG tablet Take 81 mg by mouth daily. Swallow whole.   atorvastatin (LIPITOR) 20 MG tablet Take 1 tablet by mouth daily.   hydroxyurea (HYDREA) 500 MG capsule Take 1,000 mg by mouth daily. May take with food to minimize GI side effects.   No facility-administered encounter medications on file as of 04/01/2022.    Allergies as of 04/01/2022   (No Known Allergies)    Past Medical History:  Diagnosis Date   History of prostate cancer    Hyperlipidemia    Myeloproliferative disorder [facility])     Past Surgical History:  Procedure Laterality Date   PROSTATECTOMY  2017    Family History  Problem Relation Age of Onset   Cancer Mother        unknown ca   Cancer Father        lung ca   Cancer Daughter         breast ca    Social History   Socioeconomic History   Marital status: Married    Spouse name: Not on file   Number of children: 1   Years of education: Not on file   Highest education level: Not on file  Occupational History   Not on file  Tobacco Use   Smoking status: Former    Types: Cigarettes    Quit date: 1985    Years since quitting: 38.9   Smokeless tobacco: Never  Substance and Sexual Activity   Alcohol use: Not Currently   Drug use: Never   Sexual activity: Not on file  Other Topics Concern   Not on file  Social History Narrative   Not on file   Social Determinants of Health   Financial Resource Strain: Not on file  Food Insecurity: Not on file  Transportation Needs: Not on file  Physical Activity: Not on file  Stress: Not on file  Social Connections: Not on file  Intimate Partner Violence: Not on file    Review of systems: Review of Systems  Constitutional: Negative for fever and chills.  HENT: Negative.   Eyes: Negative for blurred vision.  Respiratory: as per HPI  Cardiovascular: Negative for chest pain and palpitations.  Gastrointestinal: Negative for vomiting, diarrhea, blood per rectum. Genitourinary: Negative for dysuria, urgency, frequency and hematuria.  Musculoskeletal: Negative for myalgias, back pain and joint  pain.  Skin: Negative for itching and rash.  Neurological: Negative for dizziness, tremors, focal weakness, seizures and loss of consciousness.  Endo/Heme/Allergies: Negative for environmental allergies.  Psychiatric/Behavioral: Negative for depression, suicidal ideas and hallucinations.  All other systems reviewed and are negative.  Physical Exam: Blood pressure 136/70, pulse 77, temperature 97.6 F (36.4 C), temperature source Oral, height '5\' 7"'$  (1.702 m), weight 164 lb 6.4 oz (74.6 kg), SpO2 96 %. Gen:      No acute distress HEENT:  EOMI, sclera anicteric Neck:     No masses; no thyromegaly Lungs:    Bibasal crackles CV:          Regular rate and rhythm; no murmurs Abd:      + bowel sounds; soft, non-tender; no palpable masses, no distension Ext:    No edema; adequate peripheral perfusion Skin:      Warm and dry; no rash Neuro: alert and oriented x 3 Psych: normal mood and affect  Data Reviewed: Imaging: CT abdomen pelvis 10/09/2018-atelectasis at the lung base CTA 11/01/2018- atelectasis at the lung base Chest x-ray 03/07/2022-chronic interstitial prominence, mild basilar predominance. I have reviewed the images personally.  PFTs:  Labs:  Assessment:  Assessment for interstitial lung disease He has some crackles on examination and chest x-ray suggestive of interstitial changes However CTA in 2020 demonstrates just mild atelectasis at the She is a 1030base He does not have significant exposures or signs and symptoms of connective tissue disease  Will get baseline connective tissue disease serologies, schedule high-res CT and PFTs for better evaluation Further workup is based after review of above tests.  Plan/Recommendations: Labs, CT, PFTs  Marshell Garfinkel MD [facility] 04/01/2022, 10:01 AM  CC: Pomposini, [name], MD

## 2022-04-03 LAB — RHEUMATOID FACTOR: Rheumatoid fact SerPl-aCnc: <14 IU/mL (ref <14)

## 2022-04-03 LAB — CYCLIC CITRUL PEPTIDE ANTIBODY, IGG: Cyclic Citrullin Peptide Ab: <16 UNITS

## 2022-04-03 LAB — ANTI-NUCLEAR AB-TITER (ANA TITER)
ANA TITER: 1:80 {titer} — ABNORMAL HIGH
ANA Titer 1: 1:40 {titer} — ABNORMAL HIGH

## 2022-04-03 LAB — ANA: Anti Nuclear Antibody (ANA): POSITIVE — AB

## 2022-04-03 LAB — ANTI-DNA ANTIBODY, DOUBLE-STRANDED: ds DNA Ab: <1 IU/mL

## 2022-04-14 ENCOUNTER — Inpatient Hospital Stay (HOSPITAL_BASED_OUTPATIENT_CLINIC_OR_DEPARTMENT_OTHER): Payer: Medicare PPO | Admitting: Hematology and Oncology

## 2022-04-14 ENCOUNTER — Other Ambulatory Visit: Payer: Self-pay

## 2022-04-14 ENCOUNTER — Inpatient Hospital Stay: Payer: Medicare PPO | Attending: Hematology and Oncology

## 2022-04-14 ENCOUNTER — Encounter: Payer: Self-pay | Admitting: Hematology and Oncology

## 2022-04-14 VITALS — BP 133/62 | HR 60 | Temp 98.1°F | Resp 18 | Ht 67.0 in | Wt 168.2 lb

## 2022-04-14 DIAGNOSIS — Z8546 Personal history of malignant neoplasm of prostate: Secondary | ICD-10-CM | POA: Insufficient documentation

## 2022-04-14 DIAGNOSIS — D75839 Thrombocytosis, unspecified: Secondary | ICD-10-CM | POA: Insufficient documentation

## 2022-04-14 DIAGNOSIS — J849 Interstitial pulmonary disease, unspecified: Secondary | ICD-10-CM | POA: Insufficient documentation

## 2022-04-14 DIAGNOSIS — D473 Essential (hemorrhagic) thrombocythemia: Secondary | ICD-10-CM

## 2022-04-14 DIAGNOSIS — N182 Chronic kidney disease, stage 2 (mild): Secondary | ICD-10-CM

## 2022-04-14 LAB — CBC WITH DIFFERENTIAL/PLATELET
Abs Immature Granulocytes: 0.04 10*3/uL (ref 0.00–0.07)
Basophils Absolute: 0.1 10*3/uL (ref 0.0–0.1)
Basophils Relative: 2 %
Eosinophils Absolute: 0.2 10*3/uL (ref 0.0–0.5)
Eosinophils Relative: 3 %
HCT: 41.8 % (ref 39.0–52.0)
Hemoglobin: 13.9 g/dL (ref 13.0–17.0)
Immature Granulocytes: 1 %
Lymphocytes Relative: 17 %
Lymphs Abs: 1.3 10*3/uL (ref 0.7–4.0)
MCH: 35.7 pg — ABNORMAL HIGH (ref 26.0–34.0)
MCHC: 33.3 g/dL (ref 30.0–36.0)
MCV: 107.5 fL — ABNORMAL HIGH (ref 80.0–100.0)
Monocytes Absolute: 1 10*3/uL (ref 0.1–1.0)
Monocytes Relative: 13 %
Neutro Abs: 4.7 10*3/uL (ref 1.7–7.7)
Neutrophils Relative %: 64 %
Platelets: 367 10*3/uL (ref 150–400)
RBC: 3.89 MIL/uL — ABNORMAL LOW (ref 4.22–5.81)
RDW: 18 % — ABNORMAL HIGH (ref 11.5–15.5)
WBC: 7.3 10*3/uL (ref 4.0–10.5)
nRBC: 0 % (ref 0.0–0.2)

## 2022-04-14 NOTE — Assessment & Plan Note (Signed)
He has been referred to see pulmonologist to evaluate the cause of his interstitial lung disease; the consult is appreciated and he will continue future follow-up with pulmonologist

## 2022-04-14 NOTE — Assessment & Plan Note (Signed)
He responded well to current dose of hydroxyurea with resolution of thrombocytosis He has no side effects from treatment I recommend he continues taking hydroxyurea 1000 mg daily along with aspirin I plan to see him again in 3 months for further follow-up We discussed risk of bone marrow suppression in cases of severe stress such as surgery and infection; if the patient needs surgery or have severe infection, he will call me and we might have to see him sooner

## 2022-04-14 NOTE — Progress Notes (Signed)
[place] OFFICE PROGRESS NOTE  Patient Care Team: Pomposini, [name], MD as PCP - General (Internal Medicine)  ASSESSMENT & PLAN:  Essential thrombocytosis ([place]) He responded well to current dose of hydroxyurea with resolution of thrombocytosis He has no side effects from treatment I recommend he continues taking hydroxyurea 1000 mg daily along with aspirin I plan to see him again in 3 months for further follow-up We discussed risk of bone marrow suppression in cases of severe stress such as surgery and infection; if the patient needs surgery or have severe infection, he will call me and we might have to see him sooner  Interstitial lung disease ([place]) He has been referred to see pulmonologist to evaluate the cause of his interstitial lung disease; the consult is appreciated and he will continue future follow-up with pulmonologist  No orders of the defined types were placed in this encounter.   All questions were answered. The patient knows to call the clinic with any problems, questions or concerns. The total time spent in the appointment was 20 minutes encounter with patients including review of chart and various tests results, discussions about plan of care and coordination of care plan   Heath Lark, MD 04/14/2022 12:51 PM  INTERVAL HISTORY: Please see below for problem oriented charting. he returns for treatment follow-up with his wife and daughter He is doing well He has no side effects from treatment so far No recent infection  REVIEW OF SYSTEMS:   Constitutional: Denies fevers, chills or abnormal weight loss Eyes: Denies blurriness of vision Ears, nose, mouth, throat, and face: Denies mucositis or sore throat Respiratory: Denies cough, dyspnea or wheezes Cardiovascular: Denies palpitation, chest discomfort or lower extremity swelling Gastrointestinal:  Denies nausea, heartburn or change in bowel habits Skin: Denies abnormal skin rashes Lymphatics:  Denies new lymphadenopathy or easy bruising Neurological:Denies numbness, tingling or new weaknesses Behavioral/Psych: Mood is stable, no new changes  All other systems were reviewed with the patient and are negative.  I have reviewed the past medical history, past surgical history, social history and family history with the patient and they are unchanged from previous note.  ALLERGIES:  has No Known Allergies.  MEDICATIONS:  Current Outpatient Medications  Medication Sig Dispense Refill   aspirin EC 81 MG tablet Take 81 mg by mouth daily. Swallow whole.     atorvastatin (LIPITOR) 20 MG tablet Take 1 tablet by mouth daily.     hydroxyurea (HYDREA) 500 MG capsule Take 1,000 mg by mouth daily. May take with food to minimize GI side effects.     No current facility-administered medications for this visit.    SUMMARY OF ONCOLOGIC HISTORY: He has been diagnosed with MPL mutation/essential thrombocytosis from [place] He is here accompanied by his daughter, Gaelen and his wife The patient have remote history of prostate cancer status post surgery in 2017 and was told he was cured without adjuvant treatment.  He has regular PSA monitoring through his local urologist A year ago, he was noted to have elevated platelet count close to 800,000 The patient was referred to see a local hematologist in [place].  According to him, his hematologist is a locum physician.  The patient undergone a series of blood work but never had bone marrow biopsy done He was never told the diagnosis of his condition.  He was prescribed hydroxyurea Initially, he was started on 500 mg daily and the dose was subsequently increased to 1000 mg.  According to the patient, he has  been to see his hematologist for several visits but each visits were very brief and he was never told the results of his blood work and was not provided full explanation as to why he needs to take hydroxyurea He was also recommended to take aspirin but  due to lack of explanation, he has not been compliant taking that Prior to the diagnosis of myeloproliferative disorder, he denies abnormal night sweats, skin itching or others.  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 According to documentation of his chart, he was diagnosed with interstitial lung disease from abnormal imaging studies but he was never told about this history He has occasional cough but denies history of hemoptysis or shortness of breath The patient was recommended to continue taking hydroxyurea at 1000 mg daily along with aspirin  PHYSICAL EXAMINATION: ECOG PERFORMANCE STATUS: 0 - Asymptomatic  Vitals:   04/14/22 1111  BP: 133/62  Pulse: 60  Resp: 18  Temp: 98.1 F (36.7 C)  SpO2: 96%   Filed Weights   04/14/22 1111  Weight: 168 lb 3.2 oz (76.3 kg)    GENERAL:alert, no distress and comfortable NEURO: alert & oriented x 3 with fluent speech, no focal motor/sensory deficits  LABORATORY DATA:  I have reviewed the data as listed    Component Value Date/Time   NA 139 03/17/2022 1355   K 5.4 (H) 03/17/2022 1355   CL 103 03/17/2022 1355   CO2 31 03/17/2022 1355   GLUCOSE 101 (H) 03/17/2022 1355   BUN 17 03/17/2022 1355   CREATININE 0.91 03/17/2022 1355   CALCIUM 10.2 03/17/2022 1355   PROT 7.8 03/03/2022 1248   ALBUMIN 4.3 03/03/2022 1248   AST 27 03/03/2022 1248   ALT 48 (H) 03/03/2022 1248   ALKPHOS 75 03/03/2022 1248   BILITOT 0.4 03/03/2022 1248   GFRNONAA >60 03/17/2022 1355    No results found for: "SPEP", "UPEP"  Lab Results  Component Value Date   WBC 7.3 04/14/2022   NEUTROABS 4.7 04/14/2022   HGB 13.9 04/14/2022   HCT 41.8 04/14/2022   MCV 107.5 (H) 04/14/2022   PLT 367 04/14/2022      Chemistry      Component Value Date/Time   NA 139 03/17/2022 1355   K 5.4 (H) 03/17/2022 1355   CL 103 03/17/2022 1355   CO2 31 03/17/2022 1355   BUN 17 03/17/2022  1355   CREATININE 0.91 03/17/2022 1355      Component Value Date/Time   CALCIUM 10.2 03/17/2022 1355   ALKPHOS 75 03/03/2022 1248   AST 27 03/03/2022 1248   ALT 48 (H) 03/03/2022 1248   BILITOT 0.4 03/03/2022 1248

## 2022-05-01 ENCOUNTER — Ambulatory Visit
Admission: RE | Admit: 2022-05-01 | Discharge: 2022-05-01 | Disposition: A | Discharge status: Home / Self Care | Payer: Medicare PPO | Source: Ambulatory Visit | Attending: Pulmonary Disease | Admitting: Pulmonary Disease

## 2022-05-01 DIAGNOSIS — J849 Interstitial pulmonary disease, unspecified: Secondary | ICD-10-CM

## 2022-05-07 ENCOUNTER — Telehealth: Payer: Self-pay | Admitting: Pulmonary Disease

## 2022-05-07 DIAGNOSIS — J849 Interstitial pulmonary disease, unspecified: Secondary | ICD-10-CM

## 2022-05-07 NOTE — Telephone Encounter (Signed)
Dr. Vaughan Browner can you please advise on patients CT scan?

## 2022-05-08 NOTE — Telephone Encounter (Signed)
I called his number but it went to voice mail. Please let him know that the CT shows interstitial lung disease which means inflammation and scarring in the lung.  Please send a hypersensitivity panel and give a ILD questionnaire. Make a sooner visit with me with PFTs to review results

## 2022-05-08 NOTE — Telephone Encounter (Signed)
Spoke with pt and reviewed Dr. Matilde Bash CT results with him. Pt stated understanding. Pt was scheduled for PFT at [facility] on 05/13/22 and a consult OV with Dr. Vaughan Browner on 06/02/22. Blood work orders were placed and ILD packet mailed out to pt. Pt stated understanding. Nothing further needed at this time.   Routing to Dr. Vaughan Browner as Juluis Rainier

## 2022-05-13 ENCOUNTER — Ambulatory Visit (INDEPENDENT_AMBULATORY_CARE_PROVIDER_SITE_OTHER): Payer: Medicare PPO | Admitting: Pulmonary Disease

## 2022-05-13 DIAGNOSIS — J849 Interstitial pulmonary disease, unspecified: Secondary | ICD-10-CM | POA: Diagnosis not present

## 2022-05-13 LAB — PULMONARY FUNCTION TEST
DL/VA % pred: 95 %
DL/VA: 3.79 ml/min/mmHg/L
DLCO cor % pred: 62 %
DLCO cor: 13.48 ml/min/mmHg
DLCO unc % pred: 61 %
DLCO unc: 13.2 ml/min/mmHg
FEF 25-75 Post: 2.36 L/sec
FEF 25-75 Pre: 1.87 L/sec
FEF2575-%Change-Post: 26 %
FEF2575-%Pred-Post: 142 %
FEF2575-%Pred-Pre: 112 %
FEV1-%Change-Post: 4 %
FEV1-%Pred-Post: 86 %
FEV1-%Pred-Pre: 82 %
FEV1-Post: 2.09 L
FEV1-Pre: 2 L
FEV1FVC-%Change-Post: 7 %
FEV1FVC-%Pred-Pre: 109 %
FEV6-%Change-Post: -1 %
FEV6-%Pred-Post: 78 %
FEV6-%Pred-Pre: 79 %
FEV6-Post: 2.48 L
FEV6-Pre: 2.52 L
FEV6FVC-%Change-Post: 1 %
FEV6FVC-%Pred-Post: 107 %
FEV6FVC-%Pred-Pre: 106 %
FVC-%Change-Post: -2 %
FVC-%Pred-Post: 72 %
FVC-%Pred-Pre: 74 %
FVC-Post: 2.49 L
FVC-Pre: 2.55 L
Post FEV1/FVC ratio: 84 %
Post FEV6/FVC ratio: 100 %
Pre FEV1/FVC ratio: 78 %
Pre FEV6/FVC Ratio: 99 %
RV % pred: 58 %
RV: 1.42 L
TLC % pred: 78 %
TLC: 4.89 L

## 2022-05-13 NOTE — Progress Notes (Signed)
Full PFT Performed Today  

## 2022-05-13 NOTE — Patient Instructions (Signed)
Full PFT Performed Today  

## 2022-06-02 ENCOUNTER — Encounter: Payer: Self-pay | Admitting: Pulmonary Disease

## 2022-06-02 ENCOUNTER — Ambulatory Visit: Payer: Medicare PPO | Admitting: Pulmonary Disease

## 2022-06-02 DIAGNOSIS — J849 Interstitial pulmonary disease, unspecified: Secondary | ICD-10-CM

## 2022-06-02 NOTE — Patient Instructions (Addendum)
Will check some labs today to evaluate any exposure and give a questionnaire We have reviewed the CT scan and PFTs which does show interstitial lung disease with moderate reduction in lung capacity I discussed the case with some colleagues in conference Return to clinic in 3 months

## 2022-06-02 NOTE — Progress Notes (Signed)
Gordon Love    [id number]    08/03/1940  Primary Care Physician:Pomposini, [name], MD  Referring Physician: Clinton Quant, MD No address on file  Chief complaint: Consult for interstitial lung disease  HPI: 82 y.o. who  has a past medical history of History of prostate cancer, Hyperlipidemia, and Myeloproliferative disorder (Gordon Love).   He follows with Dr. 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  Chest crackles were noted on examination and subsequent chest x-ray showed possible interstitial changes.  He has been referred here for further evaluation  States that his breathing is doing fine with no issues.  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  No feather pillows or comforters ILD questionnaire 04/01/2022- Negative Smoking history: 10-pack-year smoker.  Quit in 1980 Travel history: Lives in [place].  No significant travel Relevant family history: No family history of lung disease.  Interim history: Here for review of CT and PFTs.  States that breathing is stable with no issues.  Outpatient Encounter Medications as of 06/02/2022  Medication Sig   aspirin EC 81 MG tablet Take 81 mg by mouth daily. Swallow whole.   atorvastatin (LIPITOR) 20 MG tablet Take 1 tablet by mouth daily.   hydroxyurea (HYDREA) 500 MG capsule Take 1,000 mg by mouth daily. May take with food to minimize GI side effects.   No facility-administered encounter medications on file as of 06/02/2022.   Physical Exam: Blood pressure 136/70, pulse 77, temperature 97.6 F (36.4 C), temperature source Oral, height '5\' 7"'$  (1.702 m), weight 164 lb 6.4 oz (74.6 kg), SpO2 96 %. Gen:      No acute distress HEENT:  EOMI, sclera anicteric Neck:     No masses; no thyromegaly Lungs:    Bibasal crackles CV:          Regular rate and rhythm; no murmurs Abd:      + bowel sounds; soft, non-tender; no palpable masses, no distension Ext:    No edema; adequate peripheral perfusion Skin:      Warm and dry; no rash Neuro: alert and oriented x 3 Psych: normal mood and affect  Data Reviewed: Imaging: CT abdomen pelvis 10/09/2018-atelectasis at the lung base CTA 11/01/2018- atelectasis at the lung base Chest x-ray 03/07/2022-chronic interstitial prominence, mild basilar predominance. High resolution CT 05/01/22-subpleural and peribronchovascular reticular opacities with upper lobe predominance.  Suggestive of alternative diagnosis.  4 mm right upper lobe lung nodule. I have reviewed the images personally.  PFTs: 05/13/2022 FVC 2.49 [72%], FEV1 2.09 [86%], F/F84, TLC 4.89 [78%], DLCO 13.20 [61%] Minimal restriction, moderate diffusion defect  Labs: ILD panel 04/01/2022-positive for ANA 1: 40, nuclear speckled  Assessment:  Assessment for interstitial lung disease He has some crackles on examination and CT scan suggestive of interstitial changes in alternate pattern He does not have significant exposures or signs and symptoms of connective tissue disease.  ANA is borderline elevated which is likely nonspecific  Follow-up CT and PFTs reviewed Will get further lab test for hypersensitive panel though I suspect it will be negative..  Will discuss case at multidisciplinary conference We discussed further workup including bronchoscope with biopsy, BAL versus surgical lung biopsy but they are reluctant to undergo an invasive procedure and want to talk about it more  Plan/Recommendations: HP panel, ILD  questionnaire Discuss at ILD conference.  Gordon Garfinkel MD [facility] 06/02/2022, 9:06 AM  CC: Pomposini, [name], MD

## 2022-06-07 LAB — HYPERSENSITIVITY PNEUMONITIS
A. Pullulans Abs: NEGATIVE
A.Fumigatus #1 Abs: NEGATIVE
Micropolyspora faeni, IgG: NEGATIVE
Pigeon Serum Abs: NEGATIVE
Thermoact. Saccharii: NEGATIVE
Thermoactinomyces vulgaris, IgG: NEGATIVE

## 2022-06-11 DIAGNOSIS — R9431 Abnormal electrocardiogram [ECG] [EKG]: Secondary | ICD-10-CM | POA: Insufficient documentation

## 2022-06-11 NOTE — Progress Notes (Unsigned)
Cardiology Office Note   Date:  06/12/2022   ID:  Earley, Pock 04-13-1941, MRN [id number]  PCP:  Clinton Quant, MD  Cardiologist:   None Referring:  Pomposini, [name], MD    Chief Complaint  Patient presents with   Atrial Fibrillation     History of Present Illness: Gordon Love is a 82 y.o. male who is referred for evaluation of an abnormal EKG. he was noted to have this apparently his primary care office on the right have EKG today.  The patient is an excellent description what sounds like premature ectopic beats.   I see PACs mentioned in the primary care note he does not feel these.  He does not have any skipping heartbeats.  He does not have any presyncope or syncope.  He has no chest pressure, neck or arm discomfort.  He has no weight gain or edema.  He stays physically active.  He feels the garden.  He trims.  Has had no symptoms related to this.  Past Medical History:  Diagnosis Date   History of prostate cancer    Hyperlipidemia    Myeloproliferative disorder [facility])     Past Surgical History:  Procedure Laterality Date   PROSTATECTOMY  2017     Current Outpatient Medications  Medication Sig Dispense Refill   aspirin EC 81 MG tablet Take 81 mg by mouth daily. Swallow whole.     atorvastatin (LIPITOR) 20 MG tablet Take 1 tablet by mouth daily.     hydroxyurea (HYDREA) 500 MG capsule Take 1,000 mg by mouth daily. May take with food to minimize GI side effects.     No current facility-administered medications for this visit.    Allergies:   Patient has no known allergies.    Social History:  The patient  reports that he quit smoking about 39 years ago. His smoking use included cigarettes. He has never used smokeless tobacco. He reports that he does not currently use alcohol. He reports that he does not use drugs.   Family History:  The patient's family history includes Cancer in his daughter, father, and mother.    ROS:  Please see  the history of present illness.   Otherwise, review of systems are positive for none.   All other systems are reviewed and negative.    PHYSICAL EXAM: VS:  BP (!) 142/76   Pulse (!) 58   Ht 5' 7"$  (1.702 m)   Wt 167 lb 6.4 oz (75.9 kg)   SpO2 97%   BMI 26.22 kg/m  , BMI Body mass index is 26.22 kg/m. GENERAL:  Well appearing HEENT:  Pupils equal round and reactive, fundi not visualized, oral mucosa unremarkable NECK:  No jugular venous distention, waveform within normal limits, carotid upstroke brisk and symmetric, no bruits, no thyromegaly LYMPHATICS:  No cervical, inguinal adenopathy LUNGS:  Clear to auscultation bilaterally BACK:  No CVA tenderness CHEST:  Unremarkable HEART:  PMI not displaced or sustained,S1 and S2 within normal limits, no S3, no S4, no clicks, no rubs, no murmurs ABD:  Flat, positive bowel sounds normal in frequency in pitch, no bruits, no rebound, no guarding, no midline pulsatile mass, no hepatomegaly, no splenomegaly EXT:  2 plus pulses throughout, no edema, no cyanosis no clubbing SKIN:  No rashes no nodules NEURO:  Cranial nerves II through XII grossly intact, motor grossly intact throughout PSYCH:  Cognitively intact, oriented to person place and time    EKG:  EKG  is ordered today. The ekg ordered today demonstrates sinus rhythm, rate 67, axis within normal limits, intervals within normal limits, nonspecific inferior ST changes.   Recent Labs: 03/03/2022: ALT 48 03/17/2022: BUN 17; Creatinine 0.91; Potassium 5.4; Sodium 139 04/14/2022: Hemoglobin 13.9; Platelets 367    Lipid Panel No results found for: "CHOL", "TRIG", "HDL", "CHOLHDL", "VLDL", "LDLCALC", "LDLDIRECT"    Wt Readings from Last 3 Encounters:  06/12/22 167 lb 6.4 oz (75.9 kg)  06/02/22 171 lb (77.6 kg)  04/14/22 168 lb 3.2 oz (76.3 kg)      Other studies Reviewed: Additional studies/ records that were reviewed today include: Office records. Review of the above records  demonstrates:  Please see elsewhere in the note.     ASSESSMENT AND PLAN:  Abnormal EKG: It sounds like he has been having premature ectopic contractions and I will get the EKG from his primary provider.  However, his exam is unremarkable.  He is not having any symptoms.  He has minimal cardiovascular risk factors.  No change in therapy or further testing is indicated.   Current medicines are reviewed at length with the patient today.  The patient does not have concerns regarding medicines.  The following changes have been made:  no change  Labs/ tests ordered today include: None No orders of the defined types were placed in this encounter.    Disposition:   FU with me as needed.     Signed, Minus Breeding, MD  06/12/2022 2:05 PM    [place]

## 2022-06-12 ENCOUNTER — Ambulatory Visit: Payer: Medicare PPO | Attending: Cardiology | Admitting: Cardiology

## 2022-06-12 ENCOUNTER — Encounter: Payer: Self-pay | Admitting: Cardiology

## 2022-06-12 VITALS — BP 142/76 | HR 58 | Ht 67.0 in | Wt 167.4 lb

## 2022-06-12 DIAGNOSIS — R9431 Abnormal electrocardiogram [ECG] [EKG]: Secondary | ICD-10-CM | POA: Diagnosis not present

## 2022-06-12 NOTE — Addendum Note (Signed)
Addended by: Amalia Hailey on: 06/12/2022 05:14 PM   Modules accepted: Orders

## 2022-06-12 NOTE — Patient Instructions (Signed)
    Follow-Up: At  [facility], you and your health needs are our priority.  As part of our continuing mission to provide you with exceptional heart care, we have created designated Provider Care Teams.  These Care Teams include your primary Cardiologist (physician) and Advanced Practice Providers (APPs -  Physician Assistants and Nurse Practitioners) who all work together to provide you with the care you need, when you need it.  We recommend signing up for the patient portal called "MyChart".  Sign up information is provided on this After Visit Summary.  MyChart is used to connect with patients for Virtual Visits (Telemedicine).  Patients are able to view lab/test results, encounter notes, upcoming appointments, etc.  Non-urgent messages can be sent to your provider as well.   To learn more about what you can do with MyChart, go to [email or web address].    Your next appointment:   As needed        

## 2022-07-08 ENCOUNTER — Ambulatory Visit (INDEPENDENT_AMBULATORY_CARE_PROVIDER_SITE_OTHER): Payer: Medicare PPO | Admitting: Pulmonary Disease

## 2022-07-08 DIAGNOSIS — J849 Interstitial pulmonary disease, unspecified: Secondary | ICD-10-CM | POA: Diagnosis not present

## 2022-07-11 NOTE — Progress Notes (Signed)
Interstitial Lung Disease Multidisciplinary Conference   Gordon Love    MRN [id number]    DOB 1941/03/18  Primary Care Physician:Pomposini, [name], MD  Referring Physician: Dr. Marshell Garfinkel MD  Time of Conference: 7.30am- 8.30am Date of conference: 07/08/2022 Location of Conference: -  Virtual  Participating Pulmonary: Dr. Brand Males, MD,  Dr Marshell Garfinkel, MD Pathology: Dr Jaquita Folds, MD ,  Radiology: Dr Salvatore Marvel MD Others:   Brief History:  History of prostate cancer, Hyperlipidemia, and Myeloproliferative disorder. evaluated for crackles on auscultation. He has abnormal CT with moderate diffusion impairment on PFTs ( DLCLO 61%). He does not have significant exposures or signs and symptoms of connective tissue disease.  ANA is borderline elevated which is likely nonspecific  Please review scan and suggest further work up- biopsy vs bronchoscope   PFT    Latest Ref Rng & Units 05/13/2022   10:33 AM  PFT Results  FVC-Pre L 2.55   FVC-Predicted Pre % 74   FVC-Post L 2.49   FVC-Predicted Post % 72   Pre FEV1/FVC % % 78   Post FEV1/FCV % % 84   FEV1-Pre L 2.00   FEV1-Predicted Pre % 82   FEV1-Post L 2.09   DLCO uncorrected ml/min/mmHg 13.20   DLCO UNC% % 61   DLCO corrected ml/min/mmHg 13.48   DLCO COR %Predicted % 62   DLVA Predicted % 95   TLC L 4.89   TLC % Predicted % 78   RV % Predicted % 58       MDD discussion of CT scan   05/01/2022 CT reviewed with emphysema, upper lobe nodularity. Traction bronchiectasis with subpleural reticulation, upper lobe predominant with no gradient. Sugtgestive of HP pneumonitis.No air trapping as techinuque was poor.    - Concordance with official report: Yes  Pathology discussion of biopsy :   MDD Impression/Recs:  Unspecified interstitial lung disease in alternate diagnosis with no clear cause.  Alternate diagnosis.  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 An Official ATS/JRS/ALAT Clinical Practice Guideline. Ragu G et al, [place] Aug 1;202(3):e36-e69.       Diagnosis of Idiopathic Pulmonary Fibrosis. An Official ATS/ERS/JRS/ALAT Clinical Practice Guideline. Raghu G et al, [place]. 2018 Sep 1;198(5):e44-e68.   IPF Suspected   Histopath ology Pattern      UIP  Probable UIP  Indeterminate for  UIP  Alternative  diagnosis    UIP  IPF  IPF  IPF  Non-IPF dx   HRCT   Probabe UIP  IPF  IPF  IPF (Likely)**  Non-IPF dx  Pattern  Indeterminate for UIP  IPF  IPF (Likely)**  Indeterminate  for IPF**  Non-IPF dx    Alternative diagnosis  IPF (Likely)**/ non-IPF dx  Non-IPF dx  Non-IPF dx  Non-IPF dx     Idiopathic pulmonary fibrosis diagnosis based upon HRCT and Biopsy paterns.  ** IPF is the likely diagnosis when any of following features are present:  Moderate-to-severe  traction bronchiectasis/bronchiolectasis (defined as mild traction bronchiectasis/bronchiolectasis in four or more lobes including the lingual as a lobe, or moderate to severe traction bronchiectasis in two or more lobes) in a man over age 6 years or in a woman over age 8 years Extensive (>30%) reticulation on HRCT and an age >70 years  Increased neutrophils and/or absence of lymphocytosis in BAL fluid  Multidisciplinary discussion reaches a confident diagnosis of IPF.    **Indeterminate for IPF  Without an adequate biopsy is unlikely to be IPF  With an adequate biopsy may be reclassified to a more specific diagnosis after multidisciplinary discussion and/or additional consultation.   dx = diagnosis; HRCT = high-resolution computed tomography; IPF = idiopathic pulmonary fibrosis; UIP = usual interstitial pneumonia.

## 2022-07-15 ENCOUNTER — Encounter: Payer: Self-pay | Admitting: Hematology and Oncology

## 2022-07-15 ENCOUNTER — Inpatient Hospital Stay: Payer: Medicare PPO | Admitting: Hematology and Oncology

## 2022-07-15 ENCOUNTER — Other Ambulatory Visit: Payer: Self-pay

## 2022-07-15 ENCOUNTER — Inpatient Hospital Stay: Payer: Medicare PPO | Attending: Hematology and Oncology

## 2022-07-15 VITALS — BP 138/78 | HR 77 | Temp 97.9°F | Resp 18 | Ht 67.0 in | Wt 169.2 lb

## 2022-07-15 DIAGNOSIS — J849 Interstitial pulmonary disease, unspecified: Secondary | ICD-10-CM | POA: Diagnosis not present

## 2022-07-15 DIAGNOSIS — D473 Essential (hemorrhagic) thrombocythemia: Secondary | ICD-10-CM

## 2022-07-15 DIAGNOSIS — D75839 Thrombocytosis, unspecified: Secondary | ICD-10-CM | POA: Insufficient documentation

## 2022-07-15 DIAGNOSIS — N182 Chronic kidney disease, stage 2 (mild): Secondary | ICD-10-CM

## 2022-07-15 LAB — CBC WITH DIFFERENTIAL/PLATELET
Abs Immature Granulocytes: 0.02 10*3/uL (ref 0.00–0.07)
Basophils Absolute: 0.1 10*3/uL (ref 0.0–0.1)
Basophils Relative: 1 %
Eosinophils Absolute: 0.1 10*3/uL (ref 0.0–0.5)
Eosinophils Relative: 2 %
HCT: 41.8 % (ref 39.0–52.0)
Hemoglobin: 14.6 g/dL (ref 13.0–17.0)
Immature Granulocytes: 0 %
Lymphocytes Relative: 22 %
Lymphs Abs: 1.2 10*3/uL (ref 0.7–4.0)
MCH: 41.1 pg — ABNORMAL HIGH (ref 26.0–34.0)
MCHC: 34.9 g/dL (ref 30.0–36.0)
MCV: 117.7 fL — ABNORMAL HIGH (ref 80.0–100.0)
Monocytes Absolute: 0.6 10*3/uL (ref 0.1–1.0)
Monocytes Relative: 11 %
Neutro Abs: 3.5 10*3/uL (ref 1.7–7.7)
Neutrophils Relative %: 64 %
Platelets: 269 10*3/uL (ref 150–400)
RBC: 3.55 MIL/uL — ABNORMAL LOW (ref 4.22–5.81)
RDW: 13.2 % (ref 11.5–15.5)
WBC: 5.5 10*3/uL (ref 4.0–10.5)
nRBC: 0 % (ref 0.0–0.2)

## 2022-07-15 NOTE — Assessment & Plan Note (Signed)
He has been referred to see pulmonologist to evaluate the cause of his interstitial lung disease; the consult is appreciated and he will continue future follow-up with pulmonologist 

## 2022-07-15 NOTE — Assessment & Plan Note (Signed)
He responded well to current dose of hydroxyurea with resolution of thrombocytosis He has no side effects from treatment I recommend he continues taking hydroxyurea 1000 mg daily along with aspirin I plan to see him again in 3 months for further follow-up We discussed risk of bone marrow suppression in cases of severe stress such as surgery and infection; if the patient needs surgery or have severe infection, he will call me and we might have to see him sooner 

## 2022-07-15 NOTE — Progress Notes (Signed)
[place] OFFICE PROGRESS NOTE  Patient Care Team: Pomposini, [name], MD as PCP - General (Internal Medicine)  ASSESSMENT & PLAN:  Essential thrombocytosis ([place]) He responded well to current dose of hydroxyurea with resolution of thrombocytosis He has no side effects from treatment I recommend he continues taking hydroxyurea 1000 mg daily along with aspirin I plan to see him again in 3 months for further follow-up We discussed risk of bone marrow suppression in cases of severe stress such as surgery and infection; if the patient needs surgery or have severe infection, he will call me and we might have to see him sooner  Interstitial lung disease ([place]) He has been referred to see pulmonologist to evaluate the cause of his interstitial lung disease; the consult is appreciated and he will continue future follow-up with pulmonologist  No orders of the defined types were placed in this encounter.   All questions were answered. The patient knows to call the clinic with any problems, questions or concerns. The total time spent in the appointment was 20 minutes encounter with patients including review of chart and various tests results, discussions about plan of care and coordination of care plan   Heath Lark, MD 07/15/2022 11:35 AM  INTERVAL HISTORY: Please see below for problem oriented charting. he returns for treatment follow-up with his family He rated treatment well Denies missing doses No recent infection We reviewed test results and discussed future follow-up  REVIEW OF SYSTEMS:   Constitutional: Denies fevers, chills or abnormal weight loss Eyes: Denies blurriness of vision Ears, nose, mouth, throat, and face: Denies mucositis or sore throat Respiratory: Denies cough, dyspnea or wheezes Cardiovascular: Denies palpitation, chest discomfort or lower extremity swelling Gastrointestinal:  Denies nausea, heartburn or change in bowel habits Skin: Denies abnormal  skin rashes Lymphatics: Denies new lymphadenopathy or easy bruising Neurological:Denies numbness, tingling or new weaknesses Behavioral/Psych: Mood is stable, no new changes  All other systems were reviewed with the patient and are negative.  I have reviewed the past medical history, past surgical history, social history and family history with the patient and they are unchanged from previous note.  ALLERGIES:  has No Known Allergies.  MEDICATIONS:  Current Outpatient Medications  Medication Sig Dispense Refill   aspirin EC 81 MG tablet Take 81 mg by mouth daily. Swallow whole.     atorvastatin (LIPITOR) 20 MG tablet Take 1 tablet by mouth daily.     hydroxyurea (HYDREA) 500 MG capsule Take 1,000 mg by mouth daily. May take with food to minimize GI side effects.     No current facility-administered medications for this visit.    SUMMARY OF ONCOLOGIC HISTORY: Oncology History  Essential thrombocytosis ([place])  03/03/2022 Initial Diagnosis   Essential thrombocytosis ([place])   03/03/2022 Miscellaneous   He has been diagnosed with MPL mutation/essential thrombocytosis from [place] He is here accompanied by his daughter, Henry and his wife The patient have remote history of prostate cancer status post surgery in 2017 and was told he was cured without adjuvant treatment.  He has regular PSA monitoring through his local urologist A year ago, he was noted to have elevated platelet count close to 800,000 The patient was referred to see a local hematologist in [place].  According to him, his hematologist is a locum physician.  The patient undergone a series of blood work but never had bone marrow biopsy done He was never told the diagnosis of his condition.  He was prescribed hydroxyurea Initially, he was  started on 500 mg daily and the dose was subsequently increased to 1000 mg.  According to the patient, he has been to see his hematologist for several visits but each visits were very brief and  he was never told the results of his blood work and was not provided full explanation as to why he needs to take hydroxyurea He was also recommended to take aspirin but due to lack of explanation, he has not been compliant taking that Prior to the diagnosis of myeloproliferative disorder, he denies abnormal night sweats, skin itching or others.  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 According to documentation of his chart, he was diagnosed with interstitial lung disease from abnormal imaging studies but he was never told about this history He has occasional cough but denies history of hemoptysis or shortness of breath The patient was recommended to continue taking hydroxyurea at 1000 mg daily along with aspirin     PHYSICAL EXAMINATION: ECOG PERFORMANCE STATUS: 0 - Asymptomatic  Vitals:   07/15/22 1122  BP: 138/78  Pulse: 77  Resp: 18  Temp: 97.9 F (36.6 C)  SpO2: 99%   Filed Weights   07/15/22 1122  Weight: 169 lb 3.2 oz (76.7 kg)    GENERAL:alert, no distress and comfortable  NEURO: alert & oriented x 3 with fluent speech, no focal motor/sensory deficits  LABORATORY DATA:  I have reviewed the data as listed    Component Value Date/Time   NA 139 03/17/2022 1355   K 5.4 (H) 03/17/2022 1355   CL 103 03/17/2022 1355   CO2 31 03/17/2022 1355   GLUCOSE 101 (H) 03/17/2022 1355   BUN 17 03/17/2022 1355   CREATININE 0.91 03/17/2022 1355   CALCIUM 10.2 03/17/2022 1355   PROT 7.8 03/03/2022 1248   ALBUMIN 4.3 03/03/2022 1248   AST 27 03/03/2022 1248   ALT 48 (H) 03/03/2022 1248   ALKPHOS 75 03/03/2022 1248   BILITOT 0.4 03/03/2022 1248   GFRNONAA >60 03/17/2022 1355    No results found for: "SPEP", "UPEP"  Lab Results  Component Value Date   WBC 5.5 07/15/2022   NEUTROABS 3.5 07/15/2022   HGB 14.6 07/15/2022   HCT 41.8 07/15/2022   MCV 117.7 (H) 07/15/2022   PLT 269 07/15/2022       Chemistry      Component Value Date/Time   NA 139 03/17/2022 1355   K 5.4 (H) 03/17/2022 1355   CL 103 03/17/2022 1355   CO2 31 03/17/2022 1355   BUN 17 03/17/2022 1355   CREATININE 0.91 03/17/2022 1355      Component Value Date/Time   CALCIUM 10.2 03/17/2022 1355   ALKPHOS 75 03/03/2022 1248   AST 27 03/03/2022 1248   ALT 48 (H) 03/03/2022 1248   BILITOT 0.4 03/03/2022 1248

## 2022-10-16 ENCOUNTER — Inpatient Hospital Stay: Payer: Medicare PPO | Admitting: Hematology and Oncology

## 2022-10-16 ENCOUNTER — Encounter: Payer: Self-pay | Admitting: Hematology and Oncology

## 2022-10-16 ENCOUNTER — Inpatient Hospital Stay: Payer: Medicare PPO | Attending: Hematology and Oncology

## 2022-10-16 ENCOUNTER — Other Ambulatory Visit: Payer: Self-pay

## 2022-10-16 VITALS — BP 137/77 | HR 73 | Temp 97.4°F | Resp 18 | Ht 67.0 in | Wt 176.2 lb

## 2022-10-16 DIAGNOSIS — Z8546 Personal history of malignant neoplasm of prostate: Secondary | ICD-10-CM | POA: Diagnosis not present

## 2022-10-16 DIAGNOSIS — N182 Chronic kidney disease, stage 2 (mild): Secondary | ICD-10-CM

## 2022-10-16 DIAGNOSIS — Z85828 Personal history of other malignant neoplasm of skin: Secondary | ICD-10-CM

## 2022-10-16 DIAGNOSIS — D473 Essential (hemorrhagic) thrombocythemia: Secondary | ICD-10-CM | POA: Diagnosis present

## 2022-10-16 DIAGNOSIS — D75839 Thrombocytosis, unspecified: Secondary | ICD-10-CM | POA: Diagnosis not present

## 2022-10-16 LAB — CBC WITH DIFFERENTIAL/PLATELET
Abs Immature Granulocytes: 0.04 10*3/uL (ref 0.00–0.07)
Basophils Absolute: 0.1 10*3/uL (ref 0.0–0.1)
Basophils Relative: 1 %
Eosinophils Absolute: 0.2 10*3/uL (ref 0.0–0.5)
Eosinophils Relative: 2 %
HCT: 40.6 % (ref 39.0–52.0)
Hemoglobin: 13.8 g/dL (ref 13.0–17.0)
Immature Granulocytes: 1 %
Lymphocytes Relative: 18 %
Lymphs Abs: 1.2 10*3/uL (ref 0.7–4.0)
MCH: 40.9 pg — ABNORMAL HIGH (ref 26.0–34.0)
MCHC: 34 g/dL (ref 30.0–36.0)
MCV: 120.5 fL — ABNORMAL HIGH (ref 80.0–100.0)
Monocytes Absolute: 0.7 10*3/uL (ref 0.1–1.0)
Monocytes Relative: 11 %
Neutro Abs: 4.5 10*3/uL (ref 1.7–7.7)
Neutrophils Relative %: 67 %
Platelets: 295 10*3/uL (ref 150–400)
RBC: 3.37 MIL/uL — ABNORMAL LOW (ref 4.22–5.81)
RDW: 11.8 % (ref 11.5–15.5)
WBC: 6.7 10*3/uL (ref 4.0–10.5)
nRBC: 0 % (ref 0.0–0.2)

## 2022-10-16 NOTE — Assessment & Plan Note (Signed)
He had recent surgery We discussed importance of avoidance of excessive sun exposure He will continue close follow-up with dermatologist

## 2022-10-16 NOTE — Assessment & Plan Note (Signed)
He responded well to current dose of hydroxyurea with resolution of thrombocytosis He has no side effects from treatment I recommend he continues taking hydroxyurea 1000 mg daily along with aspirin I plan to see him again in 5 months for further follow-up

## 2022-10-16 NOTE — Progress Notes (Signed)
[facility] OFFICE PROGRESS NOTE  Patient Care Team: Pomposini, Rande Brunt, MD as PCP - General (Internal Medicine)  ASSESSMENT & PLAN:  Essential thrombocytosis (HCC) He responded well to current dose of hydroxyurea with resolution of thrombocytosis He has no side effects from treatment I recommend he continues taking hydroxyurea 1000 mg daily along with aspirin I plan to see him again in 5 months for further follow-up  History of skin cancer He had recent surgery We discussed importance of avoidance of excessive sun exposure He will continue close follow-up with dermatologist  No orders of the defined types were placed in this encounter.   All questions were answered. The patient knows to call the clinic with any problems, questions or concerns. The total time spent in the appointment was 20 minutes encounter with patients including review of chart and various tests results, discussions about plan of care and coordination of care plan   Artis Delay, MD 10/16/2022 11:51 AM  INTERVAL HISTORY: Please see below for problem oriented charting. he returns for treatment follow-up on hydroxyurea He is here with his wife He had recent skin surgery He is compliant taking his medications as directed No side effects so far  REVIEW OF SYSTEMS:   Constitutional: Denies fevers, chills or abnormal weight loss Eyes: Denies blurriness of vision Ears, nose, mouth, throat, and face: Denies mucositis or sore throat Respiratory: Denies cough, dyspnea or wheezes Cardiovascular: Denies palpitation, chest discomfort or lower extremity swelling Gastrointestinal:  Denies nausea, heartburn or change in bowel habits Skin: Denies abnormal skin rashes Lymphatics: Denies new lymphadenopathy or easy bruising Neurological:Denies numbness, tingling or new weaknesses Behavioral/Psych: Mood is stable, no new changes  All other systems were reviewed with the patient and are negative.  I have  reviewed the past medical history, past surgical history, social history and family history with the patient and they are unchanged from previous note.  ALLERGIES:  has No Known Allergies.  MEDICATIONS:  Current Outpatient Medications  Medication Sig Dispense Refill   aspirin EC 81 MG tablet Take 81 mg by mouth daily. Swallow whole.     atorvastatin (LIPITOR) 20 MG tablet Take 1 tablet by mouth daily.     hydroxyurea (HYDREA) 500 MG capsule Take 1,000 mg by mouth daily. May take with food to minimize GI side effects.     No current facility-administered medications for this visit.    SUMMARY OF ONCOLOGIC HISTORY: Oncology History  Essential thrombocytosis (HCC)  03/03/2022 Initial Diagnosis   Essential thrombocytosis (HCC)   03/03/2022 Miscellaneous   He has been diagnosed with MPL mutation/essential thrombocytosis from [place] He is here accompanied by his daughter, Yannis and his wife The patient have remote history of prostate cancer status post surgery in 2017 and was told he was cured without adjuvant treatment.  He has regular PSA monitoring through his local urologist A year ago, he was noted to have elevated platelet count close to 800,000 The patient was referred to see a local hematologist in [place].  According to him, his hematologist is a locum physician.  The patient undergone a series of blood work but never had bone marrow biopsy done He was never told the diagnosis of his condition.  He was prescribed hydroxyurea Initially, he was started on 500 mg daily and the dose was subsequently increased to 1000 mg.  According to the patient, he has been to see his hematologist for several visits but each visits were very brief and he was never told the  results of his blood work and was not provided full explanation as to why he needs to take hydroxyurea He was also recommended to take aspirin but due to lack of explanation, he has not been compliant taking that Prior to the  diagnosis of myeloproliferative disorder, he denies abnormal night sweats, skin itching or others.  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 According to documentation of his chart, he was diagnosed with interstitial lung disease from abnormal imaging studies but he was never told about this history He has occasional cough but denies history of hemoptysis or shortness of breath The patient was recommended to continue taking hydroxyurea at 1000 mg daily along with aspirin     PHYSICAL EXAMINATION: ECOG PERFORMANCE STATUS: 0 - Asymptomatic  Vitals:   10/16/22 1039  BP: 137/77  Pulse: 73  Resp: 18  Temp: (!) 97.4 F (36.3 C)  SpO2: 95%   Filed Weights   10/16/22 1039  Weight: 176 lb 3.2 oz (79.9 kg)    GENERAL:alert, no distress and comfortable SKIN: He has bandages on the right side of his nose NEURO: alert & oriented x 3 with fluent speech, no focal motor/sensory deficits  LABORATORY DATA:  I have reviewed the data as listed    Component Value Date/Time   NA 139 03/17/2022 1355   K 5.4 (H) 03/17/2022 1355   CL 103 03/17/2022 1355   CO2 31 03/17/2022 1355   GLUCOSE 101 (H) 03/17/2022 1355   BUN 17 03/17/2022 1355   CREATININE 0.91 03/17/2022 1355   CALCIUM 10.2 03/17/2022 1355   PROT 7.8 03/03/2022 1248   ALBUMIN 4.3 03/03/2022 1248   AST 27 03/03/2022 1248   ALT 48 (H) 03/03/2022 1248   ALKPHOS 75 03/03/2022 1248   BILITOT 0.4 03/03/2022 1248   GFRNONAA >60 03/17/2022 1355    No results found for: "SPEP", "UPEP"  Lab Results  Component Value Date   WBC 6.7 10/16/2022   NEUTROABS 4.5 10/16/2022   HGB 13.8 10/16/2022   HCT 40.6 10/16/2022   MCV 120.5 (H) 10/16/2022   PLT 295 10/16/2022      Chemistry      Component Value Date/Time   NA 139 03/17/2022 1355   K 5.4 (H) 03/17/2022 1355   CL 103 03/17/2022 1355   CO2 31 03/17/2022 1355   BUN 17 03/17/2022 1355   CREATININE  0.91 03/17/2022 1355      Component Value Date/Time   CALCIUM 10.2 03/17/2022 1355   ALKPHOS 75 03/03/2022 1248   AST 27 03/03/2022 1248   ALT 48 (H) 03/03/2022 1248   BILITOT 0.4 03/03/2022 1248

## 2022-12-01 ENCOUNTER — Encounter: Payer: Self-pay | Admitting: Nurse Practitioner

## 2022-12-30 ENCOUNTER — Other Ambulatory Visit: Payer: Self-pay

## 2022-12-30 DIAGNOSIS — J849 Interstitial pulmonary disease, unspecified: Secondary | ICD-10-CM

## 2022-12-31 ENCOUNTER — Ambulatory Visit (INDEPENDENT_AMBULATORY_CARE_PROVIDER_SITE_OTHER): Payer: Medicare PPO | Admitting: Pulmonary Disease

## 2022-12-31 ENCOUNTER — Encounter: Payer: Self-pay | Admitting: Pulmonary Disease

## 2022-12-31 ENCOUNTER — Ambulatory Visit: Payer: Medicare PPO | Admitting: Pulmonary Disease

## 2022-12-31 VITALS — BP 134/66 | HR 71 | Temp 97.5°F | Ht 67.0 in | Wt 173.4 lb

## 2022-12-31 DIAGNOSIS — J849 Interstitial pulmonary disease, unspecified: Secondary | ICD-10-CM

## 2022-12-31 LAB — PULMONARY FUNCTION TEST
DL/VA % pred: 86 %
DL/VA: 3.41 ml/min/mmHg/L
DLCO cor % pred: 54 %
DLCO cor: 11.66 ml/min/mmHg
DLCO unc % pred: 54 %
DLCO unc: 11.66 ml/min/mmHg
FEF 25-75 Post: 2.83 L/s
FEF 25-75 Pre: 2.16 L/s
FEF2575-%Change-Post: 31 %
FEF2575-%Pred-Post: 175 %
FEF2575-%Pred-Pre: 133 %
FEV1-%Change-Post: 6 %
FEV1-%Pred-Post: 82 %
FEV1-%Pred-Pre: 77 %
FEV1-Post: 1.95 L
FEV1-Pre: 1.83 L
FEV1FVC-%Change-Post: 2 %
FEV1FVC-%Pred-Pre: 115 %
FEV6-%Change-Post: 3 %
FEV6-%Pred-Post: 73 %
FEV6-%Pred-Pre: 71 %
FEV6-Post: 2.3 L
FEV6-Pre: 2.22 L
FEV6FVC-%Pred-Post: 108 %
FEV6FVC-%Pred-Pre: 108 %
FVC-%Change-Post: 3 %
FVC-%Pred-Post: 68 %
FVC-%Pred-Pre: 65 %
FVC-Post: 2.3 L
FVC-Pre: 2.22 L
Post FEV1/FVC ratio: 84 %
Post FEV6/FVC ratio: 100 %
Pre FEV1/FVC ratio: 82 %
Pre FEV6/FVC Ratio: 100 %
RV % pred: 55 %
RV: 1.35 L
TLC % pred: 67 %
TLC: 4.23 L

## 2022-12-31 NOTE — Patient Instructions (Signed)
We have ordered a follow-up CT scan in January I will see her in time to review and plan for next steps.

## 2022-12-31 NOTE — Progress Notes (Signed)
Gordon Love    [id number]    06/19/1940  Primary Care Physician:Pomposini, Rande Brunt, MD  Referring Physician: Eldridge Abrahams, MD No address on file  Chief complaint: Consult for interstitial lung disease  HPI: 82 y.o. who  has a past medical history of History of prostate cancer, Hyperlipidemia, and Myeloproliferative disorder (HCC).   He follows with Dr. Bertis Ruddy search for essential thrombocytosis, MPL mutation for which he is on hydroxyurea.  Chest crackles were noted on examination and subsequent chest x-ray showed possible interstitial changes.  He has been referred here for further evaluation  States that his breathing is doing fine with no issues.  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  No feather pillows or comforters ILD questionnaire 04/01/2022- Negative Smoking history: 10-pack-year smoker.  Quit in 1980 Travel history: Lives in [place].  No significant travel Relevant family history: No family history of lung disease.  Interim history: Here for review of PFTs.  He states that his breathing is stable with no issues Discussed at multidisciplinary conference in March 2024 with confirmation of alternate pattern.  Recommendation made for bronchoscopy with BAL and Envisia versus surgical lung biopsy.  Outpatient Encounter Medications as of 12/31/2022  Medication Sig   aspirin EC 81 MG tablet Take 81 mg by mouth daily. Swallow whole.   atorvastatin (LIPITOR) 20 MG tablet Take 1 tablet by mouth daily.   hydroxyurea (HYDREA) 500 MG capsule Take 1,000 mg by mouth daily. May take with food to minimize GI side effects.   No facility-administered encounter medications on file as of 12/31/2022.   Physical Exam: Blood pressure 134/66, pulse 71, temperature (!) 97.5 F (36.4 C), temperature source Oral, height 5'  7" (1.702 m), weight 173 lb 6.4 oz (78.7 kg), SpO2 93%. Gen:      No acute distress HEENT:  EOMI, sclera anicteric Neck:     No masses; no thyromegaly Lungs:    Bibasal crackles CV:         Regular rate and rhythm; no murmurs Abd:      + bowel sounds; soft, non-tender; no palpable masses, no distension Ext:    No edema; adequate peripheral perfusion Skin:      Warm and dry; no rash Neuro: alert and oriented x 3 Psych: normal mood and affect   Data Reviewed: Imaging: CT abdomen pelvis 10/09/2018-atelectasis at the lung base CTA 11/01/2018- atelectasis at the lung base Chest x-ray 03/07/2022-chronic interstitial prominence, mild basilar predominance. High resolution CT 05/01/22-subpleural and peribronchovascular reticular opacities with upper lobe predominance.  Suggestive of alternative diagnosis.  4 mm right upper lobe lung nodule. I have reviewed the images personally.  PFTs: 05/13/2022 FVC 2.49 [72%], FEV1 2.09 [86%], F/F84, TLC 4.89 [78%], DLCO 13.20 [61%] Minimal restriction, moderate diffusion defect  12/31/2022 FVC 2.30 [67], FEV1 1.95 [82%], F/F84 TLC 4.23 (67%], DLCO 11.66 (54%] Mild restriction, moderate diffusion defect  Labs: ILD panel 04/01/2022-positive for ANA 1: 40, nuclear speckled Hypersensitivity panel 06/01/2022-negative  Assessment:  Assessment for interstitial lung disease He has some crackles on examination and CT scan suggestive of interstitial changes in alternate pattern He does not have significant exposures or signs and symptoms of connective tissue disease.  ANA is borderline elevated which is likely nonspecific, hypersensitivity panel is negative  Follow-up CT and PFTs reviewed We discussed case  at multidisciplinary conference and recommended further workup including bronchoscope with biopsy, BAL versus surgical lung biopsy but they are reluctant to undergo an invasive procedure and want to talk about it more  Follow-up scan in 4  months  Plan/Recommendations: Follow-up scan  Chilton Greathouse MD [facility] 12/31/2022, 11:07 AM  CC: Pomposini, Rande Brunt, MD

## 2022-12-31 NOTE — Progress Notes (Signed)
Full PFT performed today. °

## 2022-12-31 NOTE — Patient Instructions (Signed)
Full PFT performed today. °

## 2023-03-19 ENCOUNTER — Inpatient Hospital Stay: Payer: Medicare PPO

## 2023-03-19 ENCOUNTER — Encounter: Payer: Self-pay | Admitting: Hematology and Oncology

## 2023-03-19 ENCOUNTER — Inpatient Hospital Stay: Payer: Medicare PPO | Attending: Hematology and Oncology | Admitting: Hematology and Oncology

## 2023-03-19 VITALS — BP 139/87 | HR 78 | Temp 97.6°F | Resp 18 | Ht 67.0 in | Wt 173.8 lb

## 2023-03-19 DIAGNOSIS — D473 Essential (hemorrhagic) thrombocythemia: Secondary | ICD-10-CM | POA: Insufficient documentation

## 2023-03-19 DIAGNOSIS — D75839 Thrombocytosis, unspecified: Secondary | ICD-10-CM | POA: Diagnosis not present

## 2023-03-19 DIAGNOSIS — N182 Chronic kidney disease, stage 2 (mild): Secondary | ICD-10-CM

## 2023-03-19 LAB — CBC WITH DIFFERENTIAL/PLATELET
Abs Immature Granulocytes: 0.05 10*3/uL (ref 0.00–0.07)
Basophils Absolute: 0.1 10*3/uL (ref 0.0–0.1)
Basophils Relative: 1 %
Eosinophils Absolute: 0.1 10*3/uL (ref 0.0–0.5)
Eosinophils Relative: 2 %
HCT: 44 % (ref 39.0–52.0)
Hemoglobin: 14.9 g/dL (ref 13.0–17.0)
Immature Granulocytes: 1 %
Lymphocytes Relative: 17 %
Lymphs Abs: 1.1 10*3/uL (ref 0.7–4.0)
MCH: 39.7 pg — ABNORMAL HIGH (ref 26.0–34.0)
MCHC: 33.9 g/dL (ref 30.0–36.0)
MCV: 117.3 fL — ABNORMAL HIGH (ref 80.0–100.0)
Monocytes Absolute: 0.9 10*3/uL (ref 0.1–1.0)
Monocytes Relative: 13 %
Neutro Abs: 4.4 10*3/uL (ref 1.7–7.7)
Neutrophils Relative %: 66 %
Platelets: 282 10*3/uL (ref 150–400)
RBC: 3.75 MIL/uL — ABNORMAL LOW (ref 4.22–5.81)
RDW: 12.1 % (ref 11.5–15.5)
WBC: 6.6 10*3/uL (ref 4.0–10.5)
nRBC: 0 % (ref 0.0–0.2)

## 2023-03-19 NOTE — Assessment & Plan Note (Signed)
He responded well to current dose of hydroxyurea with resolution of thrombocytosis He has no side effects from treatment His CBC has been normal for the last few visits I recommend he continues taking hydroxyurea 1000 mg daily along with aspirin I plan to see him again in 5 months for further follow-up

## 2023-03-19 NOTE — Progress Notes (Signed)
[facility] OFFICE PROGRESS NOTE  Patient Care Team: Pomposini, Rande Brunt, MD as PCP - General (Internal Medicine)  ASSESSMENT & PLAN:  Essential thrombocytosis (HCC) He responded well to current dose of hydroxyurea with resolution of thrombocytosis He has no side effects from treatment His CBC has been normal for the last few visits I recommend he continues taking hydroxyurea 1000 mg daily along with aspirin I plan to see him again in 5 months for further follow-up  No orders of the defined types were placed in this encounter.   All questions were answered. The patient knows to call the clinic with any problems, questions or concerns. The total time spent in the appointment was 20 minutes encounter with patients including review of chart and various tests results, discussions about plan of care and coordination of care plan   Artis Delay, MD 03/19/2023 1:21 PM  INTERVAL HISTORY: Please see below for problem oriented charting. he returns for surveillance follow-up on hydroxyurea He is doing well Denies missing doses No recent infection We discussed test results and future plan of care  REVIEW OF SYSTEMS:   Constitutional: Denies fevers, chills or abnormal weight loss Eyes: Denies blurriness of vision Ears, nose, mouth, throat, and face: Denies mucositis or sore throat Respiratory: Denies cough, dyspnea or wheezes Cardiovascular: Denies palpitation, chest discomfort or lower extremity swelling Gastrointestinal:  Denies nausea, heartburn or change in bowel habits Skin: Denies abnormal skin rashes Lymphatics: Denies new lymphadenopathy or easy bruising Neurological:Denies numbness, tingling or new weaknesses Behavioral/Psych: Mood is stable, no new changes  All other systems were reviewed with the patient and are negative.  I have reviewed the past medical history, past surgical history, social history and family history with the patient and they are unchanged  from previous note.  ALLERGIES:  has No Known Allergies.  MEDICATIONS:  Current Outpatient Medications  Medication Sig Dispense Refill   aspirin EC 81 MG tablet Take 81 mg by mouth daily. Swallow whole.     atorvastatin (LIPITOR) 20 MG tablet Take 1 tablet by mouth daily.     hydroxyurea (HYDREA) 500 MG capsule Take 1,000 mg by mouth daily. May take with food to minimize GI side effects.     No current facility-administered medications for this visit.    SUMMARY OF ONCOLOGIC HISTORY: Oncology History  Essential thrombocytosis (HCC)  03/03/2022 Initial Diagnosis   Essential thrombocytosis (HCC)   03/03/2022 Miscellaneous   He has been diagnosed with MPL mutation/essential thrombocytosis from [place] He is here accompanied by his daughter, Linda and his wife The patient have remote history of prostate cancer status post surgery in 2017 and was told he was cured without adjuvant treatment.  He has regular PSA monitoring through his local urologist A year ago, he was noted to have elevated platelet count close to 800,000 The patient was referred to see a local hematologist in [place].  According to him, his hematologist is a locum physician.  The patient undergone a series of blood work but never had bone marrow biopsy done He was never told the diagnosis of his condition.  He was prescribed hydroxyurea Initially, he was started on 500 mg daily and the dose was subsequently increased to 1000 mg.  According to the patient, he has been to see his hematologist for several visits but each visits were very brief and he was never told the results of his blood work and was not provided full explanation as to why he needs to take hydroxyurea He  was also recommended to take aspirin but due to lack of explanation, he has not been compliant taking that Prior to the diagnosis of myeloproliferative disorder, he denies abnormal night sweats, skin itching or others.  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 According to documentation of his chart, he was diagnosed with interstitial lung disease from abnormal imaging studies but he was never told about this history He has occasional cough but denies history of hemoptysis or shortness of breath The patient was recommended to continue taking hydroxyurea at 1000 mg daily along with aspirin     PHYSICAL EXAMINATION: ECOG PERFORMANCE STATUS: 0 - Asymptomatic  Vitals:   03/19/23 1102  BP: 139/87  Pulse: 78  Resp: 18  Temp: 97.6 F (36.4 C)  SpO2: 95%   Filed Weights   03/19/23 1102  Weight: 173 lb 12.8 oz (78.8 kg)    GENERAL:alert, no distress and comfortable   LABORATORY DATA:  I have reviewed the data as listed    Component Value Date/Time   NA 139 03/17/2022 1355   K 5.4 (H) 03/17/2022 1355   CL 103 03/17/2022 1355   CO2 31 03/17/2022 1355   GLUCOSE 101 (H) 03/17/2022 1355   BUN 17 03/17/2022 1355   CREATININE 0.91 03/17/2022 1355   CALCIUM 10.2 03/17/2022 1355   PROT 7.8 03/03/2022 1248   ALBUMIN 4.3 03/03/2022 1248   AST 27 03/03/2022 1248   ALT 48 (H) 03/03/2022 1248   ALKPHOS 75 03/03/2022 1248   BILITOT 0.4 03/03/2022 1248   GFRNONAA >60 03/17/2022 1355    No results found for: "SPEP", "UPEP"  Lab Results  Component Value Date   WBC 6.6 03/19/2023   NEUTROABS 4.4 03/19/2023   HGB 14.9 03/19/2023   HCT 44.0 03/19/2023   MCV 117.3 (H) 03/19/2023   PLT 282 03/19/2023      Chemistry      Component Value Date/Time   NA 139 03/17/2022 1355   K 5.4 (H) 03/17/2022 1355   CL 103 03/17/2022 1355   CO2 31 03/17/2022 1355   BUN 17 03/17/2022 1355   CREATININE 0.91 03/17/2022 1355      Component Value Date/Time   CALCIUM 10.2 03/17/2022 1355   ALKPHOS 75 03/03/2022 1248   AST 27 03/03/2022 1248   ALT 48 (H) 03/03/2022 1248   BILITOT 0.4 03/03/2022 1248

## 2023-04-27 ENCOUNTER — Other Ambulatory Visit: Payer: Self-pay | Admitting: *Deleted

## 2023-04-27 MED ORDER — HYDROXYUREA 500 MG PO CAPS: 1000.0000 mg | ORAL_CAPSULE | Freq: Every day | ORAL | 5 refills | Status: DC

## 2023-05-14 ENCOUNTER — Ambulatory Visit
Admission: RE | Admit: 2023-05-14 | Discharge: 2023-05-14 | Disposition: A | Discharge status: Home / Self Care | Payer: Medicare PPO | Source: Ambulatory Visit | Attending: Pulmonary Disease | Admitting: Pulmonary Disease

## 2023-05-14 DIAGNOSIS — J849 Interstitial pulmonary disease, unspecified: Secondary | ICD-10-CM

## 2023-08-18 ENCOUNTER — Telehealth: Payer: Self-pay

## 2023-08-18 ENCOUNTER — Inpatient Hospital Stay: Payer: Medicare PPO

## 2023-08-18 ENCOUNTER — Inpatient Hospital Stay: Payer: Medicare PPO | Attending: Hematology and Oncology | Admitting: Hematology and Oncology

## 2023-08-18 ENCOUNTER — Encounter: Payer: Self-pay | Admitting: Hematology and Oncology

## 2023-08-18 ENCOUNTER — Other Ambulatory Visit: Payer: Self-pay | Admitting: Hematology and Oncology

## 2023-08-18 VITALS — BP 130/59 | HR 69 | Temp 97.4°F | Resp 19 | Ht 67.0 in | Wt 178.4 lb

## 2023-08-18 DIAGNOSIS — Z8546 Personal history of malignant neoplasm of prostate: Secondary | ICD-10-CM | POA: Diagnosis not present

## 2023-08-18 DIAGNOSIS — R9431 Abnormal electrocardiogram [ECG] [EKG]: Secondary | ICD-10-CM | POA: Diagnosis not present

## 2023-08-18 DIAGNOSIS — J849 Interstitial pulmonary disease, unspecified: Secondary | ICD-10-CM | POA: Diagnosis not present

## 2023-08-18 DIAGNOSIS — R0600 Dyspnea, unspecified: Secondary | ICD-10-CM

## 2023-08-18 DIAGNOSIS — D473 Essential (hemorrhagic) thrombocythemia: Secondary | ICD-10-CM | POA: Diagnosis present

## 2023-08-18 DIAGNOSIS — D75839 Thrombocytosis, unspecified: Secondary | ICD-10-CM | POA: Insufficient documentation

## 2023-08-18 DIAGNOSIS — N182 Chronic kidney disease, stage 2 (mild): Secondary | ICD-10-CM

## 2023-08-18 LAB — CBC WITH DIFFERENTIAL/PLATELET
Abs Immature Granulocytes: 0.04 10*3/uL (ref 0.00–0.07)
Basophils Absolute: 0.1 10*3/uL (ref 0.0–0.1)
Basophils Relative: 1 %
Eosinophils Absolute: 0.2 10*3/uL (ref 0.0–0.5)
Eosinophils Relative: 3 %
HCT: 42.2 % (ref 39.0–52.0)
Hemoglobin: 14.3 g/dL (ref 13.0–17.0)
Immature Granulocytes: 1 %
Lymphocytes Relative: 18 %
Lymphs Abs: 1.2 10*3/uL (ref 0.7–4.0)
MCH: 39 pg — ABNORMAL HIGH (ref 26.0–34.0)
MCHC: 33.9 g/dL (ref 30.0–36.0)
MCV: 115 fL — ABNORMAL HIGH (ref 80.0–100.0)
Monocytes Absolute: 1 10*3/uL (ref 0.1–1.0)
Monocytes Relative: 15 %
Neutro Abs: 4.1 10*3/uL (ref 1.7–7.7)
Neutrophils Relative %: 62 %
Platelets: 304 10*3/uL (ref 150–400)
RBC: 3.67 MIL/uL — ABNORMAL LOW (ref 4.22–5.81)
RDW: 13 % (ref 11.5–15.5)
WBC: 6.6 10*3/uL (ref 4.0–10.5)
nRBC: 0 % (ref 0.0–0.2)

## 2023-08-18 NOTE — Telephone Encounter (Signed)
 Called and told daughter referral sent to Dr. Lavonne Prairie. She verbalized understanding.

## 2023-08-18 NOTE — Assessment & Plan Note (Addendum)
 He has reduced stamina and shortness of breath on minimal exertion I have reviewed documentation by his pulmonologist He had recent CT imaging in January but have not yet seen his pulmonologist for recommendation Imaging study was reviewed His chest examination revealed diffuse crackles throughout I will reach out to his pulmonologist to see if he can set up return visit appointment to discuss plan of care

## 2023-08-18 NOTE — Assessment & Plan Note (Addendum)
 He has not seen his primary care doctor and has not has PSA checked recently I recommend the patient to establish new primary care doctor soon as possible

## 2023-08-18 NOTE — Assessment & Plan Note (Addendum)
 He has a history of abnormal EKG He has shortness of breath on exertion CT imaging showed possible signs of pulmonary hypertension I am concerned about his cardiac health I recommend cardiology consultation and he agrees

## 2023-08-18 NOTE — Assessment & Plan Note (Addendum)
 The patient have diagnosis of essential thrombocytosis, made elsewhere Outside records were reviewed and MPL mutation came back positive.  Test for CML FISH and JAK2 mutation as well as CAL-R are negative  He responded well to current dose of hydroxyurea  with resolution of thrombocytosis He has no side effects from treatment His CBC has been normal for the last few visits I recommend he continues taking hydroxyurea  1000 mg daily along with aspirin I plan to see him again in 5 months for further follow-up

## 2023-08-18 NOTE — Telephone Encounter (Signed)
 I modified request to Dr. Lavonne Prairie, not sure if he will get him

## 2023-08-18 NOTE — Progress Notes (Signed)
 [facility] OFFICE PROGRESS NOTE  Patient Care Team: Pomposini, Amiel Balder, MD as PCP - General (Internal Medicine)  Assessment & Plan Essential thrombocytosis [facility]) The patient have diagnosis of essential thrombocytosis, made elsewhere Outside records were reviewed and MPL mutation came back positive.  Test for CML FISH and JAK2 mutation as well as CAL-R are negative  He responded well to current dose of hydroxyurea  with resolution of thrombocytosis He has no side effects from treatment His CBC has been normal for the last few visits I recommend he continues taking hydroxyurea  1000 mg daily along with aspirin I plan to see him again in 5 months for further follow-up Interstitial lung disease (HCC) He has reduced stamina and shortness of breath on minimal exertion I have reviewed documentation by his pulmonologist He had recent CT imaging in January but have not yet seen his pulmonologist for recommendation Imaging study was reviewed His chest examination revealed diffuse crackles throughout I will reach out to his pulmonologist to see if he can set up return visit appointment to discuss plan of care Nonspecific abnormal electrocardiogram (ECG) (EKG) He has a history of abnormal EKG He has shortness of breath on exertion CT imaging showed possible signs of pulmonary hypertension I am concerned about his cardiac health I recommend cardiology consultation and he agrees History of prostate cancer He has not seen his primary care doctor and has not has PSA checked recently I recommend the patient to establish new primary care doctor soon as possible  Orders Placed This Encounter  Procedures   Ambulatory referral to Cardiology    Referral Priority:   Routine    Referral Type:   Consultation    Referral Reason:   Specialty Services Required    Number of Visits Requested:   1     Almeda Jacobs, MD  INTERVAL HISTORY: he returns for treatment follow-up on hydroxyurea  for  essential thrombocytosis He is compliant take his medication as directed No recent diagnosis of blood clot He is concerned for his health as he has shortness of breath on minimal exertion He has not seen his pulmonologist since last year He had recent CT imaging done in January I have reviewed his imaging study and documentation by pulmonologist He has not seen his primary care doctor for a long time Apparently, his primary care doctor has retired and he has not been able to find a new doctor in his local area Complications related to previous cycle of chemotherapy included none He has not had his PSA checked for a long time  PHYSICAL EXAMINATION: ECOG PERFORMANCE STATUS: 1 - Symptomatic but completely ambulatory  Vitals:   08/18/23 1004  BP: (!) 130/59  Pulse: 69  Resp: 19  Temp: (!) 97.4 F (36.3 C)  SpO2: 97%   Filed Weights   08/18/23 1004  Weight: 178 lb 6.4 oz (80.9 kg)   Cardiac exam: Regular heart rate and rhythm, no murmurs Chest exam: Diffuse crackles bilaterally, more prominent in the lung bases Relevant data reviewed during this visit included CBC and CT imaging from January 2025

## 2023-08-18 NOTE — Telephone Encounter (Signed)
 Daughter called and left a message regarding referral to cardiology. They are requesting a referral to Dr. Eilleen Grates please.

## 2023-08-20 ENCOUNTER — Telehealth: Payer: Self-pay

## 2023-08-20 NOTE — Telephone Encounter (Signed)
 Lm for patient.

## 2023-08-20 NOTE — Telephone Encounter (Signed)
-----   Message from Praveen Mannam sent at 08/20/2023  9:03 AM EDT ----- Regarding: RE: mutual patient Thanks for the message I called several times but there was no answer  Briaunna Grindstaff- Can you let the patient know that the CT looks stable to minimally progressive. Please make a follow up appointment with me ----- Message ----- From: Almeda Jacobs, MD Sent: 08/18/2023  10:15 AM EDT To: Phyllis Breeze, MD Subject: mutual patient                                 Hi,  I saw our mutual patient today He told me he had CT in Jan but has not heard back from your office Would you mind calling him and set up an appointment?  Thanks, Ni

## 2023-08-21 ENCOUNTER — Ambulatory Visit: Payer: Self-pay | Admitting: Pulmonary Disease

## 2023-08-21 ENCOUNTER — Telehealth: Payer: Self-pay

## 2023-08-21 NOTE — Telephone Encounter (Signed)
 Pt is aware of results and voiced his understanding.  Appt scheduled 10/21/2023 at 1:45. Nothing further needed.

## 2023-08-21 NOTE — Telephone Encounter (Signed)
 Spoke w/ PT daughter ok per HIPAA that Gordon Love already has an appt. On 10/21/23.  Nothing further needed

## 2023-08-21 NOTE — Telephone Encounter (Signed)
 E2C2 Pulmonary Triage - Initial Assessment Questions "Chief Complaint (e.g., cough, sob, wheezing, fever, chills, sweat or additional symptoms) *Go to specific symptom protocol after initial questions. Patient's daughter calling reports shortness of breath and difficulty breathing for 2-3 weeks, no cough, wheezing, fever, chills or sweat. Patient reports the shortness of breath and difficulty breathing occurs with exertion. Patient is still active-daughter reports it is very hard to get patient to sit still. Pulse ox readings when he has exerted himself is still 94% per daughter.  Daughter reports patient had a CT in Jan 2025. Patient and daughter state no phone calls received from pulmonary office. Per chart review, attempts to get in touch with patient were made. Daughter states neither patient or patient's wife received any messages. No appointments available soon per decision tree. Patient's daughter Gordon Love is asking to be called about an appointment. Please call daughter at [phone].   "How long have symptoms been present?" 2-3 weeks.   Have you tested for COVID or Flu? Note: If not, ask patient if a home test can be taken. If so, instruct patient to call back for positive results. No  MEDICINES:   "Have you used any OTC meds to help with symptoms?" No If yes, ask "What medications?"   "Have you used your inhalers/maintenance medication?" No If yes, "What medications?"   If inhaler, ask "How many puffs and how often?" Note: Review instructions on medication in the chart.   OXYGEN: "Do you wear supplemental oxygen?" No If yes, "How many liters are you supposed to use?"   "Do you monitor your oxygen levels?" Yes If yes, "What is your reading (oxygen level) today?" 97  "What is your usual oxygen saturation reading?"  (Note: Pulmonary O2 sats should be 90% or greater) 95-97%   Copied from CRM [id number]. Topic: Clinical - Red Word Triage >> Aug 21, 2023  9:34 AM Gordon Love  wrote: Red Word that prompted transfer to Nurse Triage: Difficultly breathing and shortness of breath for about 2 or 3 weeks now. This happens when the patient is moving or exercising. Reason for Disposition  [1] MILD longstanding difficulty breathing AND [2]  SAME as normal  Answer Assessment - Initial Assessment Questions 1. RESPIRATORY STATUS: "Describe your breathing?" (e.g., wheezing, shortness of breath, unable to speak, severe coughing)      Shortness of breath with exertion 2. ONSET: "When did this breathing problem begin?"      2-3 weeks ago 3. PATTERN "Does the difficult breathing come and go, or has it been constant since it started?"      Comes and goes with exertion 4. SEVERITY: "How bad is your breathing?" (e.g., mild, moderate, severe)    - MILD: No SOB at rest, mild SOB with walking, speaks normally in sentences, can lie down, no retractions, pulse < 100.    - MODERATE: SOB at rest, SOB with minimal exertion and prefers to sit, cannot lie down flat, speaks in phrases, mild retractions, audible wheezing, pulse 100-120.    - SEVERE: Very SOB at rest, speaks in single words, struggling to breathe, sitting hunched forward, retractions, pulse > 120      Currently no difficulty 5. RECURRENT SYMPTOM: "Have you had difficulty breathing before?" If Yes, ask: "When was the last time?" and "What happened that time?"       6. CARDIAC HISTORY: "Do you have any history of heart disease?" (e.g., heart attack, angina, bypass surgery, angioplasty)      Abnormal EKG 7. LUNG HISTORY: "  Do you have any history of lung disease?"  (e.g., pulmonary embolus, asthma, emphysema)     Interstitial lung disease 8. CAUSE: "What do you think is causing the breathing problem?"      unsure 9. OTHER SYMPTOMS: "Do you have any other symptoms? (e.g., dizziness, runny nose, cough, chest pain, fever)  Protocols used: Breathing Difficulty-A-AH

## 2023-08-21 NOTE — Telephone Encounter (Signed)
 Called Daughter and given [place] phone # to call to schedule appt with Dr. March Service. Daughter will call to schedule appt.Gordon Love

## 2023-09-09 ENCOUNTER — Encounter: Payer: Self-pay | Admitting: *Deleted

## 2023-09-09 ENCOUNTER — Encounter: Payer: Self-pay | Admitting: Cardiology

## 2023-09-09 ENCOUNTER — Ambulatory Visit (INDEPENDENT_AMBULATORY_CARE_PROVIDER_SITE_OTHER): Admitting: Cardiology

## 2023-09-09 VITALS — BP 122/80 | HR 67 | Ht 67.0 in | Wt 174.0 lb

## 2023-09-09 DIAGNOSIS — R931 Abnormal findings on diagnostic imaging of heart and coronary circulation: Secondary | ICD-10-CM | POA: Diagnosis not present

## 2023-09-09 DIAGNOSIS — R002 Palpitations: Secondary | ICD-10-CM | POA: Diagnosis not present

## 2023-09-09 DIAGNOSIS — E782 Mixed hyperlipidemia: Secondary | ICD-10-CM | POA: Diagnosis not present

## 2023-09-09 DIAGNOSIS — R0602 Shortness of breath: Secondary | ICD-10-CM | POA: Diagnosis not present

## 2023-09-09 NOTE — Progress Notes (Signed)
  Cardiology Office Note:   Date:  09/09/2023  ID:  Gordon Love, DOB 05-30-1940, MRN [id number] PCP: Rayleen Cal, MD  [facility] Providers Cardiologist:  Eilleen Grates, MD {  History of Present Illness:   Gordon Love is a 83 y.o. male  who is referred for evaluation of an abnormal EKG. he has progressive shortness of breath.  He is being seen by Dr. Waylan Haggard for evaluation of interstitial lung disease.  He actually does have a CT of his lungs and is due for follow-up with pulmonary.  I do note that he had some coronary calcium on that CT.  He has been getting progressively more short of breath.  He says he is not short of breath walking short distance across his house.  His sats will go down to the 88% range or so when he short of breath.  He is not having any chest pressure, neck or arm discomfort.  He denies any palpitations, presyncope or syncope.  He has not had any PND or orthopnea.  He said no weight gain or edema.  ROS: As stated in the HPI and negative for all other systems.  Studies Reviewed:    EKG:   EKG Interpretation Date/Time:  Wednesday Sep 09 2023 14:56:16 EDT Ventricular Rate:  67 PR Interval:  164 QRS Duration:  92 QT Interval:  382 QTC Calculation: 403 R Axis:   70  Text Interpretation: Normal sinus rhythm with sinus arrhythmia No significant change since last tracing Confirmed by Eilleen Grates ([id number]) on 09/09/2023 3:35:04 PM     Risk Assessment/Calculations:              Physical Exam:   VS:  BP 122/80   Pulse 67   Ht 5\' 7"  (1.702 m)   Wt 174 lb (78.9 kg)   BMI 27.25 kg/m    Wt Readings from Last 3 Encounters:  09/09/23 174 lb (78.9 kg)  08/18/23 178 lb 6.4 oz (80.9 kg)  03/19/23 173 lb 12.8 oz (78.8 kg)     GEN: Well nourished, well developed in no acute distress NECK: No JVD; No carotid bruits CARDIAC: RRR, no murmurs, rubs, gallops RESPIRATORY: Basilar dry crackles higher on the right than left ABDOMEN: Soft,  non-tender, non-distended EXTREMITIES:  No edema; No deformity   ASSESSMENT AND PLAN:   Shortness breath: This is most likely related to his interstitial lung disease and he does have follow-up with Dr. Waylan Haggard.  However, he does also have coronary calcium.  I would like to exclude cardiac etiology I will check a BNP and a perfusion study.  He would not be able to walk on a treadmill.  If these are normal then no further cardiac workup.  Coronary calcium: As above this will be assessed.  I would like to know what his cholesterol is and he will get a fasting lipid profile.  I would suggest a goal LDL ideally in the 70s.     Follow up with me based on the results of the above  Signed, Eilleen Grates, MD

## 2023-09-09 NOTE — Patient Instructions (Addendum)
 Medication Instructions:  Your physician recommends that you continue on your current medications as directed. Please refer to the Current Medication list given to you today.  Labwork: Your physician recommends that you return for a FASTING lipid & BNP. Please do not eat or drink for at least 8 hours when you have this done. You may take your medications that morning with a sip of water. This can be done at [facility] when you have your stress test. Please show them your orders when you register  Testing/Procedures: Your physician has requested that you have a lexiscan myoview. For further information please visit [email or web address] Please follow instruction sheet, as given.  Follow-Up: Your physician recommends that you schedule a follow-up appointment in: as needed  Any Other Special Instructions Will Be Listed Below (If Applicable).  If you need a refill on your cardiac medications before your next appointment, please call your pharmacy.

## 2023-09-16 ENCOUNTER — Telehealth (HOSPITAL_COMMUNITY): Payer: Self-pay | Admitting: Surgery

## 2023-09-16 NOTE — Telephone Encounter (Signed)
 I left the pt a VM regarding his stress test tomorrow, with his arrival time being 9:15.  I left a VM with the following instructions:  nothing to eat or drink 6 hrs before the test, no smoking 8 hrs before the test, wear comfortable clothes, do not wear any lotions/oils, and check in at the front desk on arrival.  I told the pt to call back if he had any questions.

## 2023-09-17 ENCOUNTER — Ambulatory Visit (HOSPITAL_COMMUNITY)
Admission: RE | Admit: 2023-09-17 | Discharge: 2023-09-17 | Disposition: A | Discharge status: Home / Self Care | Source: Ambulatory Visit | Attending: Cardiology | Admitting: Cardiology

## 2023-09-17 ENCOUNTER — Encounter (HOSPITAL_COMMUNITY): Payer: Self-pay

## 2023-09-17 DIAGNOSIS — R0602 Shortness of breath: Secondary | ICD-10-CM | POA: Diagnosis present

## 2023-09-17 HISTORY — DX: Malignant (primary) neoplasm, unspecified: C80.1

## 2023-09-17 LAB — NM MYOCAR MULTI W/SPECT W/WALL MOTION / EF
Base ST Depression (mm): 0 mm
Estimated workload: 1
LV dias vol: 66 mL (ref 62–150)
LV sys vol: 25 mL
MPHR: 140 {beats}/min
Nuc Stress EF: 62 %
Peak HR: 94 {beats}/min
Percent HR: 67 %
RATE: 0.5
Rest HR: 58 {beats}/min
Rest Nuclear Isotope Dose: 10.8 mCi
SDS: 0
SRS: 5
SSS: 5
ST Depression (mm): 0 mm
Stress Nuclear Isotope Dose: 29.5 mCi
TID: 0.96

## 2023-09-17 MED ORDER — TECHNETIUM TC 99M TETROFOSMIN IV KIT
30.0000 | PACK | Freq: Once | INTRAVENOUS | Status: AC | PRN
Start: 2023-09-17 — End: 2023-09-17
  Administered 2023-09-17: 29.5 via INTRAVENOUS

## 2023-09-17 MED ORDER — REGADENOSON 0.4 MG/5ML IV SOLN
INTRAVENOUS | Status: AC
  Administered 2023-09-17: 0.4 mg via INTRAVENOUS
  Filled 2023-09-17: qty 5

## 2023-09-17 MED ORDER — SODIUM CHLORIDE FLUSH 0.9 % IV SOLN
INTRAVENOUS | Status: AC
  Administered 2023-09-17: 10 mL via INTRAVENOUS
  Filled 2023-09-17: qty 10

## 2023-09-17 MED ORDER — TECHNETIUM TC 99M TETROFOSMIN IV KIT
10.0000 | PACK | Freq: Once | INTRAVENOUS | Status: AC | PRN
  Administered 2023-09-17: 10.8 via INTRAVENOUS

## 2023-09-18 ENCOUNTER — Ambulatory Visit: Payer: Self-pay | Admitting: Cardiology

## 2023-10-21 ENCOUNTER — Ambulatory Visit (INDEPENDENT_AMBULATORY_CARE_PROVIDER_SITE_OTHER): Admitting: Pulmonary Disease

## 2023-10-21 ENCOUNTER — Encounter: Payer: Self-pay | Admitting: Pulmonary Disease

## 2023-10-21 VITALS — BP 124/88 | HR 70 | Ht 67.0 in | Wt 174.6 lb

## 2023-10-21 DIAGNOSIS — J849 Interstitial pulmonary disease, unspecified: Secondary | ICD-10-CM | POA: Diagnosis not present

## 2023-10-21 DIAGNOSIS — Z87891 Personal history of nicotine dependence: Secondary | ICD-10-CM | POA: Diagnosis not present

## 2023-10-21 MED ORDER — TRELEGY ELLIPTA 200-62.5-25 MCG/ACT IN AEPB: 1.0000 | INHALATION_SPRAY | Freq: Every day | RESPIRATORY_TRACT | 3 refills | Status: DC

## 2023-10-21 NOTE — Patient Instructions (Signed)
 VISIT SUMMARY:  During your visit, we discussed your worsening shortness of breath over the past two months, particularly when walking or performing activities. We reviewed your history of interstitial lung disease and recent normal heart evaluation. Your symptoms may be related to your lung condition or other pulmonary issues.  YOUR PLAN:  -INTERSTITIAL LUNG DISEASE: Interstitial lung disease refers to a group of lung disorders that cause scarring of lung tissues, leading to breathing difficulties. Your recent CT scan showed stable findings, but due to your worsening symptoms, we will reassess your condition with a new chest CT scan. You are prescribed a Trelegy inhaler, which contains a bronchodilator and steroid, to help manage your symptoms. We will also conduct a pulmonary function test to further evaluate your lung function. If the CT scan remains nonspecific, we may consider a lung biopsy or bronchoscopy.  -SHORTNESS OF BREATH: Shortness of breath, or dyspnea, is a condition where you feel like you can't get enough air. Your symptoms have worsened over the past two months, especially with exertion. Since your heart evaluation was normal and there are no signs of asthma, your symptoms are likely related to your interstitial lung disease. The Trelegy inhaler should help alleviate your symptoms. We will also perform a chest CT scan and a pulmonary function test to better understand your condition.  INSTRUCTIONS:  1. Use the Trelegy inhaler daily as prescribed to manage your symptoms. 2. Schedule and complete the chest CT scan to reassess your pulmonary condition. 3. Schedule and complete the pulmonary function test for further evaluation. 4. Follow up with us  after completing these tests to discuss the results and next steps.

## 2023-10-21 NOTE — Progress Notes (Signed)
 Gordon Love    [id number]    August 04, 1940  Primary Care Physician:Pomposini, Toribio CROME, MD  Referring Physician: Sharla Toribio CROME, MD No address on file  Chief complaint: Follow-up for interstitial lung disease  HPI: 83 y.o. who  has a past medical history of [facility]), History of prostate cancer, Hyperlipidemia, and Myeloproliferative disorder (HCC).   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  Chest crackles were noted on examination and subsequent chest x-ray showed possible interstitial changes.  He has been referred here for further evaluation  Discussed at multidisciplinary conference in March 2024 with confirmation of alternate pattern.  Recommendation made for bronchoscopy with BAL and Envisia versus surgical lung biopsy but patient did not want to go through with procedure  Interim history: Discussed the use of AI scribe software for clinical note transcription with the patient, who gave verbal consent to proceed.  History of Present Illness Gordon Love is an 83 year old male with interstitial lung disease who presents with worsening shortness of breath. He is accompanied by his wife, daughter, and son.   Over the past two months, he has experienced worsening shortness of breath, particularly when walking from one room to another. The symptoms began suddenly around the time of increased pollen levels, initially thought to be related to allergies. He tried taking Zyrtec with minimal relief.  He experiences significant shortness of breath when walking outside or performing activities such as gardening. He can mow the grass while sitting down, but walking any distance causes him to struggle for breath. No cough, sputum production, or wheezing. Oxygen saturation was noted to be 92% during the visit.  Last year, he was evaluated for ILD. A follow-up CT scan in January showed stable or  minimally changed findings. He recently underwent a chemical cardiac stress test, which was normal, and his heart was evaluated and found to be fine.   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  No feather pillows or comforters ILD questionnaire 04/01/2022- Negative Smoking history: 10-pack-year smoker.  Quit in 1980 Travel history: Lives in [place] .  No significant travel Relevant family history: No family history of lung disease.  Outpatient Encounter Medications as of 10/21/2023  Medication Sig   aspirin EC 81 MG tablet Take 81 mg by mouth daily. Swallow whole.   atorvastatin (LIPITOR) 20 MG tablet Take 1 tablet by mouth daily.   hydroxyurea  (HYDREA ) 500 MG capsule Take 2 capsules (1,000 mg total) by mouth daily. May take with food to minimize GI side effects.   MAGNESIUM GLUCONATE PO Take by mouth daily.   Multiple Vitamin (MULTIVITAMIN) tablet Take 1 tablet by mouth daily.   No facility-administered encounter medications on file as of 10/21/2023.   Physical Exam: Blood pressure 124/88, pulse 70, height 5' 7 (1.702 m), weight 174 lb 9.6 oz (79.2 kg), SpO2 92%. Gen:      No acute distress HEENT:  EOMI, sclera anicteric Neck:     No masses; no thyromegaly Lungs:    Clear to auscultation bilaterally; normal respiratory effort CV:         Regular rate and rhythm; no murmurs Abd:      + bowel sounds; soft, non-tender; no palpable masses, no distension Ext:    No edema; adequate peripheral perfusion Neuro: alert and oriented x  3 Psych: normal mood and affect   Data Reviewed: Imaging: CT abdomen pelvis 10/09/2018-atelectasis at the lung base CTA 11/01/2018- atelectasis at the lung base Chest x-ray 03/07/2022-chronic interstitial prominence, mild basilar predominance. High resolution CT 05/01/22-subpleural and peribronchovascular reticular opacities with upper lobe predominance.  Suggestive of  alternative diagnosis.  4 mm right upper lobe lung nodule. High-resolution CT 05/18/2023-stable to minimally changed interstitial lung disease in alternate pattern I have reviewed the images personally.  PFTs: 05/13/2022 FVC 2.49 [72%], FEV1 2.09 [86%], F/F84, TLC 4.89 [78%], DLCO 13.20 [61%] Minimal restriction, moderate diffusion defect  12/31/2022 FVC 2.30 [67], FEV1 1.95 [82%], F/F84 TLC 4.23 (67%], DLCO 11.66 (54%] Mild restriction, moderate diffusion defect  Labs: ILD panel 04/01/2022-positive for ANA 1: 40, nuclear speckled Hypersensitivity panel 06/01/2022- negative Assessment & Plan Interstitial lung disease He has some crackles on examination and CT scan suggestive of interstitial changes in alternate pattern He does not have significant exposures or signs and symptoms of connective tissue disease.  ANA is borderline elevated which is likely nonspecific, hypersensitivity panel is negative. We discussed case at multidisciplinary conference in 2024 and recommended further workup including bronchoscope with biopsy, BAL versus surgical lung biopsy but at that time they are reluctant to undergo an invasive procedure and want to talk about it more  He has recent recent symptom exacerbation, including dyspnea, possibly related to pollen exposure.  Recommend getting a follow-up scan and consider lung biopsy or bronchoscopy if CT scan remains nonspecific. Trelegy inhaler, containing a bronchodilator and steroid, prescribed to manage symptoms with minimal side effects.   - Order chest CT to reassess pulmonary condition - Prescribe Trelegy inhaler for daily use to manage symptoms - Order pulmonary function test for additional evaluation - Consider bronchoscopy versus lung biopsy  Plan/Recommendations: Follow-up scan, PFTs Trial of Trelegy inhaler.   Lonna Coder MD [facility] 10/21/2023, 1:36 PM  CC: Pomposini, Toribio CROME, MD

## 2023-10-24 ENCOUNTER — Other Ambulatory Visit: Payer: Self-pay | Admitting: Hematology and Oncology

## 2023-10-28 ENCOUNTER — Ambulatory Visit
Admission: RE | Admit: 2023-10-28 | Discharge: 2023-10-28 | Disposition: A | Discharge status: Home / Self Care | Source: Ambulatory Visit | Attending: Pulmonary Disease | Admitting: Pulmonary Disease

## 2023-10-28 DIAGNOSIS — J849 Interstitial pulmonary disease, unspecified: Secondary | ICD-10-CM

## 2023-11-05 ENCOUNTER — Telehealth: Payer: Self-pay | Admitting: Pulmonary Disease

## 2023-11-05 NOTE — Telephone Encounter (Addendum)
 [facility] telephone note  Reviewed CT scan dated 10/28/2023 with interstitial lung disease in alternate pattern.  Stable compared to January 2025 but there appears to be progression compared to January 2024.  I called patient on his cell phone several times to discuss CT scan results and follow-up plan but there was no answer.  Left message to call back.  Lonna Coder MD  Pulmonary & Critical care 11/05/2023, 12:28 PM

## 2023-11-09 ENCOUNTER — Encounter: Payer: Self-pay | Admitting: Pulmonary Disease

## 2023-11-09 ENCOUNTER — Telehealth: Payer: Self-pay | Admitting: Pulmonary Disease

## 2023-11-09 DIAGNOSIS — J849 Interstitial pulmonary disease, unspecified: Secondary | ICD-10-CM

## 2023-11-09 NOTE — Telephone Encounter (Signed)
Dr. Mannam, please advise. Thanks!  

## 2023-11-09 NOTE — Telephone Encounter (Signed)
 PT is ret Dr.'s Mannam's call. (See last signed tel encounter). Please call PT to advise. [phone]

## 2023-11-10 NOTE — Telephone Encounter (Signed)
 I called and spoke with the patient, reviewed the CT scan and ordered echo Can we get him to be seen by me sooner than the scheduled visit in sept?

## 2023-11-10 NOTE — Telephone Encounter (Signed)
 Copied from CRM [id number]. Topic: Clinical - Lab/Test Results >> Nov 09, 2023  9:18 AM Gordon Love wrote: Reason for CRM:   Pt is returning call from [facility], to discuss CT scan results. Contacted CAL, and transferred pt >> Nov 10, 2023  8:34 AM Gordon Love wrote: Patient calling to request that Dr. Theophilus call him today regarding his CT.   Patient calling again waiting for Dr. Theophilus to return call.

## 2023-11-10 NOTE — Telephone Encounter (Signed)
 I added clinic on 8/13-15. Please schedule then Can we also get PFTs before his visit

## 2023-11-10 NOTE — Telephone Encounter (Signed)
 Dr. Theophilus, please advise if okay to use held slot on 8/12?

## 2023-11-11 NOTE — Telephone Encounter (Signed)
 Spoke to patient and scheduled PFT 11/12/2023 at 3:00 and OV 12/09/2023 at 11:00. Nothing further needed.

## 2023-11-12 ENCOUNTER — Ambulatory Visit

## 2023-11-12 DIAGNOSIS — J849 Interstitial pulmonary disease, unspecified: Secondary | ICD-10-CM

## 2023-11-12 LAB — PULMONARY FUNCTION TEST
DL/VA % pred: 74 %
DL/VA: 2.95 ml/min/mmHg/L
DLCO unc % pred: 42 %
DLCO unc: 9.06 ml/min/mmHg
FEF 25-75 Post: 1.87 L/s
FEF 25-75 Pre: 2.34 L/s
FEF2575-%Change-Post: -20 %
FEF2575-%Pred-Post: 119 %
FEF2575-%Pred-Pre: 149 %
FEV1-%Change-Post: 0 %
FEV1-%Pred-Post: 76 %
FEV1-%Pred-Pre: 77 %
FEV1-Post: 1.78 L
FEV1-Pre: 1.8 L
FEV1FVC-%Change-Post: 0 %
FEV1FVC-%Pred-Pre: 114 %
FEV6-%Change-Post: 0 %
FEV6-%Pred-Post: 70 %
FEV6-%Pred-Pre: 71 %
FEV6-Post: 2.18 L
FEV6-Pre: 2.19 L
FEV6FVC-%Change-Post: 0 %
FEV6FVC-%Pred-Post: 108 %
FEV6FVC-%Pred-Pre: 107 %
FVC-%Change-Post: 0 %
FVC-%Pred-Post: 66 %
FVC-%Pred-Pre: 66 %
FVC-Post: 2.2 L
FVC-Pre: 2.21 L
Post FEV1/FVC ratio: 81 %
Post FEV6/FVC ratio: 100 %
Pre FEV1/FVC ratio: 81 %
Pre FEV6/FVC Ratio: 99 %
RV % pred: 40 %
RV: 1 L
TLC % pred: 50 %
TLC: 3.16 L

## 2023-11-12 NOTE — Progress Notes (Signed)
 Full pft performed today.

## 2023-11-12 NOTE — Patient Instructions (Signed)
 Full pft performed today.

## 2023-11-23 ENCOUNTER — Ambulatory Visit: Payer: Self-pay | Admitting: Pulmonary Disease

## 2023-11-23 ENCOUNTER — Telehealth: Payer: Self-pay

## 2023-11-23 NOTE — Telephone Encounter (Signed)
 Copied from CRM [id number]. Topic: Clinical - Lab/Test Results >> Nov 23, 2023  2:26 PM Gordon Love wrote: Reason for CRM: Patient is requesting [name] to please call him back again to discuss his PFT results.  Spoke with patient regarding prior message. Advised patient per Dr.Mannam   PFT shows some reduction in lung function consistent with scarring in the lung. He has a follow-up appointment scheduled in the couple of weeks. Will discuss treatment options at that time.   Patient's voice was understanding.Nothing else further needed.

## 2023-11-23 NOTE — Progress Notes (Signed)
 I called the pt and there was no answer- LMTCB. ?

## 2023-11-26 ENCOUNTER — Telehealth: Payer: Self-pay

## 2023-11-26 NOTE — Telephone Encounter (Signed)
 Received successful fax transmission confirmation regarding refill request for patient's atorvastatin. Dr. Lonn was routed refill message, but would like prescription to be managed by patient's cardiologist at this time.  Refill request sent to [facility] to the attention of Dr. Lavona.

## 2023-12-04 ENCOUNTER — Ambulatory Visit (HOSPITAL_COMMUNITY)
Admission: RE | Admit: 2023-12-04 | Discharge: 2023-12-04 | Disposition: A | Discharge status: Home / Self Care | Source: Ambulatory Visit | Attending: Pulmonary Disease | Admitting: Pulmonary Disease

## 2023-12-04 DIAGNOSIS — I351 Nonrheumatic aortic (valve) insufficiency: Secondary | ICD-10-CM | POA: Diagnosis not present

## 2023-12-04 DIAGNOSIS — R0600 Dyspnea, unspecified: Secondary | ICD-10-CM | POA: Diagnosis not present

## 2023-12-04 DIAGNOSIS — I517 Cardiomegaly: Secondary | ICD-10-CM | POA: Insufficient documentation

## 2023-12-04 DIAGNOSIS — J849 Interstitial pulmonary disease, unspecified: Secondary | ICD-10-CM | POA: Diagnosis present

## 2023-12-04 DIAGNOSIS — R0609 Other forms of dyspnea: Secondary | ICD-10-CM | POA: Diagnosis not present

## 2023-12-04 LAB — ECHOCARDIOGRAM COMPLETE
Area-P 1/2: 2.64 cm2
S' Lateral: 2.6 cm

## 2023-12-09 ENCOUNTER — Encounter: Payer: Self-pay | Admitting: Pulmonary Disease

## 2023-12-09 ENCOUNTER — Ambulatory Visit: Admitting: Pulmonary Disease

## 2023-12-09 VITALS — BP 126/82 | HR 90 | Temp 98.6°F | Ht 67.0 in | Wt 169.0 lb

## 2023-12-09 DIAGNOSIS — J849 Interstitial pulmonary disease, unspecified: Secondary | ICD-10-CM

## 2023-12-09 DIAGNOSIS — R0600 Dyspnea, unspecified: Secondary | ICD-10-CM

## 2023-12-09 LAB — COMPREHENSIVE METABOLIC PANEL WITH GFR
ALT: 37 U/L (ref 0–53)
AST: 32 U/L (ref 0–37)
Albumin: 4.2 g/dL (ref 3.5–5.2)
Alkaline Phosphatase: 80 U/L (ref 39–117)
BUN: 17 mg/dL (ref 6–23)
CO2: 29 meq/L (ref 19–32)
Calcium: 9.7 mg/dL (ref 8.4–10.5)
Chloride: 99 meq/L (ref 96–112)
Creatinine, Ser: 1.08 mg/dL (ref 0.40–1.50)
GFR: 64.5 mL/min (ref >60.00)
Glucose, Bld: 82 mg/dL (ref 70–99)
Potassium: 4.7 meq/L (ref 3.5–5.1)
Sodium: 135 meq/L (ref 135–145)
Total Bilirubin: 0.5 mg/dL (ref 0.2–1.2)
Total Protein: 8.4 g/dL — ABNORMAL HIGH (ref 6.0–8.3)

## 2023-12-09 LAB — BRAIN NATRIURETIC PEPTIDE: Pro B Natriuretic peptide (BNP): 27 pg/mL (ref 0.0–100.0)

## 2023-12-09 MED ORDER — ALBUTEROL SULFATE HFA 108 (90 BASE) MCG/ACT IN AERS: 2.0000 | INHALATION_SPRAY | Freq: Four times a day (QID) | RESPIRATORY_TRACT | 2 refills | Status: AC | PRN

## 2023-12-09 NOTE — Patient Instructions (Signed)
  VISIT SUMMARY: During today's visit, we discussed your worsening breathing issues related to interstitial lung disease. We reviewed your recent CT scans and lung function tests, and we talked about your current medications and potential new treatments. We also addressed your episodes of low oxygen levels, incidental finding of gallstones, and mild right heart enlargement.  YOUR PLAN: -INTERSTITIAL LUNG DISEASE WITH PULMONARY FIBROSIS: Interstitial lung disease with pulmonary fibrosis is a condition where the lung tissue becomes scarred and stiff, making it difficult to breathe. We decided to start you on nintedanib, a medication taken twice daily that can help slow the progression of the disease. We will monitor your liver function monthly and have discontinued the Trelegy inhaler. You will also have a baseline liver function test, and we will initiate the insurance approval process for nintedanib.  -HYPOXEMIA: Hypoxemia is a condition where there is a lower than normal level of oxygen in your blood. We will perform a walk test to assess your oxygen needs and may start you on oxygen therapy if you qualify. We are also considering nighttime oxygen therapy to help alleviate your symptoms.  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 Since you do not have any symptoms or complications from the gallstones, we recommend monitoring for any future symptoms.  -MILD RIGHT HEART ENLARGEMENT: Mild right heart enlargement means that the right side of your heart is slightly larger than normal, which can be due to the strain from your lung disease. We will continue to monitor your cardiac function.  INSTRUCTIONS: Please follow up with the walk test to assess your oxygen needs. We will also need to monitor your liver function monthly while you are on nintedanib. If you experience any new symptoms or have concerns, please contact our office.

## 2023-12-09 NOTE — Progress Notes (Signed)
 Gordon Love    [id number]    26-Jun-1940  Primary Care Physician:Patient, No Pcp Per  Referring Physician: Sharla Toribio CROME, MD No address on file  Chief complaint: Follow-up for interstitial lung disease  HPI: 83 y.o. who  has a past medical history of Cancer [facility]), History of prostate cancer, Hyperlipidemia, and Myeloproliferative disorder (HCC).   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  Chest crackles were noted on examination and subsequent chest x-ray showed possible interstitial changes.  He has been referred here for further evaluation  Discussed at multidisciplinary conference in March 2024 with confirmation of alternate pattern.  Recommendation made for bronchoscopy with BAL and Envisia versus surgical lung biopsy but patient did not want to go through with procedure  Interim history: Discussed the use of AI scribe software for clinical note transcription with the patient, who gave verbal consent to proceed.  History of Present Illness Gordon Love is an 83 year old male with interstitial lung disease who presents with worsening shortness of breath. He is accompanied by his wife, daughter, and son.   Over the past two months, he has experienced worsening shortness of breath, particularly when walking from one room to another. The symptoms began suddenly around the time of increased pollen levels, initially thought to be related to allergies. He tried taking Zyrtec with minimal relief.  He experiences significant shortness of breath when walking outside or performing activities such as gardening. He can mow the grass while sitting down, but walking any distance causes him to struggle for breath. No cough, sputum production, or wheezing. Oxygen saturation was noted to be 92% during the visit.  Last year, he was evaluated for ILD. A follow-up CT scan in January showed stable or  minimally changed findings. He recently underwent a chemical cardiac stress test, which was normal, and his heart was evaluated and found to be fine.   Interim history: Gordon Love is an 83 year old male with interstitial lung disease who presents with worsening breathing issues.  Dyspnea and hypoxemia - Worsening breathing difficulties over time - Episodes of low oxygen saturation, with levels dropping to 87% upon arrival at the clinic - Cyanosis of the lips (bluish-gray discoloration) noted on Thursday - Symptoms worsen with exertion and during sleep - No significant relief from inhaler use - Frequently experiences fatigue and malaise  Pulmonary function and imaging - History of interstitial lung disease monitored with CT scans and lung function tests - Recent CT scans stable - Lung function tests demonstrate decline - Not currently on supplemental oxygen therapy  Medication use and tolerability - Current regimen includes hydroxyurea  twice daily - Concern for potential gastrointestinal upset from hydroxyurea , possibly exacerbated by new medications under consideration for lung disease - Discussion regarding cost and necessity of inhalers and Trelegy, as his wife uses these medications  Relevant Pulmonary History 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  No feather pillows or comforters ILD questionnaire 04/01/2022- Negative Smoking history: 10-pack-year smoker.  Quit in 1980 Travel history: Lives in [place] .  No significant travel Relevant family history: No family history of lung disease.  Outpatient Encounter Medications as of 12/09/2023  Medication Sig   aspirin EC 81 MG tablet Take 81 mg by mouth daily. Swallow whole.   atorvastatin (LIPITOR) 20 MG tablet  Take 1 tablet by mouth daily.   Fluticasone-Umeclidin-Vilant (TRELEGY ELLIPTA ) 200-62.5-25 MCG/ACT AEPB Inhale 1 puff  into the lungs daily.   hydroxyurea  (HYDREA ) 500 MG capsule TAKE 2 CAPSULES BY MOUTH ONCE DAILY. MAY TAKE WITH FOOD TO MINIMIZE GI SIDE EFFECTS   MAGNESIUM GLUCONATE PO Take by mouth daily.   Multiple Vitamin (MULTIVITAMIN) tablet Take 1 tablet by mouth daily.   No facility-administered encounter medications on file as of 12/09/2023.   Vitals:   12/09/23 1105  BP: 126/82  Pulse: 90  Temp: 98.6 F (37 C)  Height: 5' 7 (1.702 m)  Weight: 169 lb (76.7 kg)  SpO2: 90%  TempSrc: Temporal  BMI (Calculated): 26.46     Physical Exam GEN: No acute distress. CV: Regular rate and rhythm, no murmurs. LUNGS: Crackles heard on auscultation due to scarring. SKIN JOINTS: Warm and dry, no rash.    Data Reviewed: Imaging: CT abdomen pelvis 10/09/2018-atelectasis at the lung base CTA 11/01/2018- atelectasis at the lung base Chest x-ray 03/07/2022-chronic interstitial prominence, mild basilar predominance. High resolution CT 05/01/22-subpleural and peribronchovascular reticular opacities with upper lobe predominance.  Suggestive of alternative diagnosis.  4 mm right upper lobe lung nodule. High-resolution CT 05/18/2023-stable to minimally changed interstitial lung disease in alternate pattern High-resolution CT 10/28/2023-stable interstitial changes in alternate pattern I have reviewed the images personally.  PFTs: 05/13/2022 FVC 2.49 [72%], FEV1 2.09 [86%], F/F84, TLC 4.89 [78%], DLCO 13.20 [61%] Minimal restriction, moderate diffusion defect  12/31/2022 FVC 2.30 [67], FEV1 1.95 [82%], F/F84 TLC 4.23 (67%], DLCO 11.66 (54%] Mild restriction, moderate diffusion defect  11/12/2023 FVC 2.20 [6 6%], FEV1 1.78 [76%], F/F81, TLC 3.16 [50%], DLCO 9.06 [42%] Moderate restriction, severe diffusion defect.  Worsening compared to 2024  Labs: ILD panel 04/01/2022-positive for ANA 1: 40, nuclear speckled Hypersensitivity panel 06/01/2022- negative  Cardiac: Echocardiogram 12/04/2023-LVEF 55-60%, grade 1  diastolic dysfunction, normal RV systolic function with mild enlargement.  TR is inadequate for measuring PA pressures.  TAPSE 1.9 cm Assessment & Plan Interstitial lung disease He has some crackles on examination and CT scan suggestive of interstitial changes in alternate pattern He does not have significant exposures or signs and symptoms of connective tissue disease.  ANA is borderline elevated which is likely nonspecific, hypersensitivity panel is negative. 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 CT scan shows stable disease, but clinical symptoms are worsening. Differential diagnosis is unclear, and biopsy is not recommended due to age-related risks and potential lack of diagnostic yield. Discussion of medication options to slow disease progression, including nintedanib and pirfenidone. Both medications can slow disease progression but are not curative. Risks include liver function impairment and gastrointestinal side effects such as nausea, indigestion, and diarrhea. Shared decision to start nintedanib, a twice-daily medication, with monitoring for side effects and liver function. - Start nintedanib twice daily - Monitor liver function monthly - Discontinue Trelegy inhaler as it is not effective - Order albuterol  rescue inhaler - Order baseline liver function tests - Initiate insurance approval process for nintedanib  Hypoxemia Hypoxemia with episodes of oxygen saturation dropping to 87%, accompanied by cyanosis and fatigue. Symptoms worsen with exertion and during sleep. Oxygen therapy is considered to alleviate symptoms and improve quality of life. Discussion of oxygen  delivery methods, including portable and tank options, based on oxygen requirements.  DME: [facility] , [place]  SATURATION QUALIFICATIONS: (This note is used to comply with regulatory documentation for home oxygen)  Patient Saturations on Room Air at Rest = 92%  Patient Saturations on Room Air while Ambulating = 85%  Patient Saturations on 2 Liters of oxygen while Ambulating = 97%  Please briefly explain why patient needs home oxygen:patient did not walk very far before O2 dropped to 85 %, placed on 2/3 L of O2 @ 97%    Gallstones without cholecystitis or obstruction Incidental finding of gallstones on CT scan without signs of cholecystitis or obstruction. No current symptoms or complications from gallstones. Conservative management is recommended due to lack of symptoms and potential surgical risks. - Monitor for symptoms of gallstone complications  Mild right heart enlargement Mild enlargement of the right side of the heart noted on echocardiogram. No significant cardiac symptoms reported. Monitoring is advised due to potential strain from lung disease and element of pulmonary hypertension - Monitor cardiac function  Plan/Recommendations: Initiate Ofev, monitor liver function Discontinue Trelegy Start supplemental oxygen   Zuzanna Maroney MD [facility] 12/09/2023, 11:00 AM  CC: Pomposini, Toribio CROME, MD

## 2023-12-11 ENCOUNTER — Telehealth: Payer: Self-pay

## 2023-12-11 NOTE — Telephone Encounter (Signed)
 S/w patient's daughter, Fitz, regarding new treatment plan put in place by patient's pulmonologist, Dr. Theophilus. Daughter wanted make Dr. Lonn aware and to get her thoughts on the plan.  Daughter aware that Dr. Lonn is in agreement with Dr. Jiles recommendations. Offered follow-up appointment on 8/25 if they wished to speak with Dr. Lonn further. Daughter comfortable with plan and does not feel that an additional appointment is needed at this time.  Daughter voiced appreciation for the call.

## 2023-12-14 ENCOUNTER — Telehealth: Payer: Self-pay | Admitting: Pulmonary Disease

## 2023-12-14 NOTE — Telephone Encounter (Signed)
 I called and spoke with the pt's daughter, Darvin  She states pt wanting to have o2 order sent to a different DME other than [facility]  He wants to use either [facility] in [place], [place] or [place] in [place], [place]   [facility] had promised to bring o2 out to the pt over the weekend and they waited at home all day with no word from them.   PCC's- are you able to use the order that is already there and send urgent to one of the companies mentioned above please?   Let me know who it's sent to so I can communicate this with the daughter. Thanks so much!

## 2023-12-14 NOTE — Telephone Encounter (Unsigned)
 Copied from CRM #[id number]. Topic: Clinical - Order For Equipment >> Dec 14, 2023 10:31 AM Corean SAUNDERS wrote: Reason for CRM: Patients daughter Rinker) is upset as [facility] left the patient without oxygen all weekend and never did show up to bring patient oxygen even though there was a drop off appointment scheduled. Severiano is requesting that the order for oxygen through [facility] be cancelled.    Kelcy is requesting a new oxygen order be sent to either [facility] in [place], [place] or [place] in [place], [place]   Please call Haider back to confirm where the order will be sent.   [phone]

## 2023-12-14 NOTE — Telephone Encounter (Signed)
 I have spoke with the daughter to let her know the 02 has been sent to [facility] in [place] per her request. Patient has [facility] PPO which is not just for [facility]

## 2023-12-15 ENCOUNTER — Telehealth: Payer: Self-pay

## 2023-12-15 NOTE — Telephone Encounter (Signed)
 Copied from CRM #[id number]. Topic: Clinical - Medical Advice >> Dec 15, 2023  8:17 AM Nathanel DEL wrote: Reason for CRM: Waddell w/ [employer] out of [place] states that got order this am for O2.  However they are not allowed to walk the pt out of the office, even thought your POC not working.  [phone] >> Dec 15, 2023  8:41 AM Russell PARAS wrote: Waddell, with [employer], returning call from [place] concerning the POC walk test.   Requested call back  CB#  [phone], opt 1   Spoke w/ Waddell of [facility]  Original walk was in notes so they could approve for the POC    - NFN

## 2023-12-15 NOTE — Telephone Encounter (Signed)
 Copied from CRM #[id number]. Topic: Clinical - Medical Advice >> Dec 15, 2023  8:17 AM Nathanel DEL wrote: Reason for CRM: Waddell w/ [facility] out of [place] states that got order this am for O2.  However they are not allowed to walk the pt out of the office, even thought your POC not working.  [phone]    Tried calling taylor of [facility] back , will noted  for next OV ppt. Do a walk for POC    -NFN

## 2023-12-21 ENCOUNTER — Other Ambulatory Visit: Payer: Self-pay

## 2023-12-21 ENCOUNTER — Telehealth: Payer: Self-pay

## 2023-12-21 NOTE — Telephone Encounter (Signed)
 Open in error

## 2023-12-21 NOTE — Telephone Encounter (Signed)
 Received CMN. Signed and faxed to [facility].

## 2023-12-31 ENCOUNTER — Encounter

## 2023-12-31 ENCOUNTER — Ambulatory Visit: Admitting: Adult Health

## 2024-01-19 ENCOUNTER — Ambulatory Visit: Payer: Self-pay | Admitting: Hematology and Oncology

## 2024-01-19 ENCOUNTER — Encounter: Payer: Self-pay | Admitting: Hematology and Oncology

## 2024-01-19 ENCOUNTER — Other Ambulatory Visit: Payer: Self-pay | Admitting: Hematology and Oncology

## 2024-01-19 ENCOUNTER — Inpatient Hospital Stay: Admitting: Hematology and Oncology

## 2024-01-19 ENCOUNTER — Inpatient Hospital Stay: Attending: Hematology and Oncology

## 2024-01-19 ENCOUNTER — Inpatient Hospital Stay

## 2024-01-19 VITALS — BP 113/77 | HR 73 | Temp 97.4°F | Resp 18 | Ht 67.0 in | Wt 170.0 lb

## 2024-01-19 DIAGNOSIS — D473 Essential (hemorrhagic) thrombocythemia: Secondary | ICD-10-CM

## 2024-01-19 DIAGNOSIS — R9431 Abnormal electrocardiogram [ECG] [EKG]: Secondary | ICD-10-CM

## 2024-01-19 DIAGNOSIS — N182 Chronic kidney disease, stage 2 (mild): Secondary | ICD-10-CM

## 2024-01-19 DIAGNOSIS — R931 Abnormal findings on diagnostic imaging of heart and coronary circulation: Secondary | ICD-10-CM | POA: Diagnosis not present

## 2024-01-19 DIAGNOSIS — Z79899 Other long term (current) drug therapy: Secondary | ICD-10-CM | POA: Insufficient documentation

## 2024-01-19 DIAGNOSIS — D75839 Thrombocytosis, unspecified: Secondary | ICD-10-CM | POA: Diagnosis not present

## 2024-01-19 LAB — CBC WITH DIFFERENTIAL/PLATELET
Abs Immature Granulocytes: 0.07 K/uL (ref 0.00–0.07)
Basophils Absolute: 0.1 K/uL (ref 0.0–0.1)
Basophils Relative: 1 %
Eosinophils Absolute: 0.2 K/uL (ref 0.0–0.5)
Eosinophils Relative: 3 %
HCT: 44.3 % (ref 39.0–52.0)
Hemoglobin: 14.7 g/dL (ref 13.0–17.0)
Immature Granulocytes: 1 %
Lymphocytes Relative: 16 %
Lymphs Abs: 1.2 K/uL (ref 0.7–4.0)
MCH: 38 pg — ABNORMAL HIGH (ref 26.0–34.0)
MCHC: 33.2 g/dL (ref 30.0–36.0)
MCV: 114.5 fL — ABNORMAL HIGH (ref 80.0–100.0)
Monocytes Absolute: 0.9 K/uL (ref 0.1–1.0)
Monocytes Relative: 13 %
Neutro Abs: 5 K/uL (ref 1.7–7.7)
Neutrophils Relative %: 66 %
Platelets: 345 K/uL (ref 150–400)
RBC: 3.87 MIL/uL — ABNORMAL LOW (ref 4.22–5.81)
RDW: 12.7 % (ref 11.5–15.5)
WBC: 7.4 K/uL (ref 4.0–10.5)
nRBC: 0 % (ref 0.0–0.2)

## 2024-01-19 LAB — LIPID PANEL
Cholesterol: 128 mg/dL (ref 0–200)
HDL: 41 mg/dL (ref >40)
LDL Cholesterol: 67 mg/dL (ref 0–99)
Total CHOL/HDL Ratio: 3.1 ratio
Triglycerides: 99 mg/dL (ref <150)
VLDL: 20 mg/dL (ref 0–40)

## 2024-01-19 MED ORDER — HYDROXYUREA 500 MG PO CAPS: 1000.0000 mg | ORAL_CAPSULE | Freq: Every day | ORAL | 1 refills | Status: AC

## 2024-01-19 MED ORDER — ATORVASTATIN CALCIUM 20 MG PO TABS: 20.0000 mg | ORAL_TABLET | Freq: Every day | ORAL | 0 refills | Status: DC

## 2024-01-19 NOTE — Assessment & Plan Note (Addendum)
 The patient have diagnosis of essential thrombocytosis, made elsewhere Outside records were reviewed and MPL mutation came back positive.  Test for CML FISH and JAK2 mutation as well as CAL-R are negative  He responded well to current dose of hydroxyurea  with resolution of thrombocytosis He has no side effects from treatment His CBC has been normal for the last few visits I recommend he continues taking hydroxyurea  1000 mg daily along with aspirin I plan to see him again in 6 months for further follow-up

## 2024-01-19 NOTE — Progress Notes (Signed)
 [facility] OFFICE PROGRESS NOTE  Gordon Love Care Team: Gordon Love, No Pcp Per as PCP - General (General Practice) Lavona Agent, MD as PCP - Cardiology (Cardiology)  Assessment & Plan Essential thrombocytosis [facility]) The Gordon Love have diagnosis of essential thrombocytosis, made elsewhere Outside records were reviewed and MPL mutation came back positive.  Test for CML FISH and JAK2 mutation as well as CAL-R are negative  He responded well to current dose of hydroxyurea  with resolution of thrombocytosis He has no side effects from treatment His CBC has been normal for the last few visits I recommend he continues taking hydroxyurea  1000 mg daily along with aspirin I plan to see him again in 6 months for further follow-up Elevated coronary artery calcium  score The Gordon Love has increased risk of heart disease He ran out of his prescription Lipitor and requested me to refill it He has not establish care with a primary care doctor I will check his lipid panel and call the Gordon Love with test results  Orders Placed This Encounter  Procedures   Lipid panel    Standing Status:   Future    Number of Occurrences:   1    Expiration Date:   01/18/2025     Almarie Bedford, MD  INTERVAL HISTORY: he returns for surveillance follow-up for myeloproliferative disorder/neoplasm Gordon Love denies recent bleeding such as epistaxis, hematuria or hematochezia No recent infection We reviewed medication list and discussed medication changes We discussed test results and future plan of care as outlined above  PHYSICAL EXAMINATION: ECOG PERFORMANCE STATUS: 0 - Asymptomatic  Vitals:   01/19/24 1030  BP: 113/77  Pulse: 73  Resp: 18  Temp: (!) 97.4 F (36.3 C)  SpO2: 93%   Lab Results  Component Value Date   WBC 7.4 01/19/2024   HGB 14.7 01/19/2024   HCT 44.3 01/19/2024   MCV 114.5 (H) 01/19/2024   PLT 345 01/19/2024

## 2024-01-19 NOTE — Assessment & Plan Note (Addendum)
 The patient has increased risk of heart disease He ran out of his prescription Lipitor and requested me to refill it He has not establish care with a primary care doctor I will check his lipid panel and call the patient with test results

## 2024-01-20 NOTE — Telephone Encounter (Signed)
-----   Message from Almarie Bedford sent at 01/19/2024  2:28 PM EDT ----- Please call him, cholesterol level is good. I sent refill to his [facility] ----- Message ----- From: Rebecka, Lab In [place] Sent: 01/19/2024   1:23 PM EDT To: Almarie Bedford, MD

## 2024-01-20 NOTE — Telephone Encounter (Signed)
Per Dr.Gorsuch, called pt with message below. Pt verbalized understanding 

## 2024-01-22 ENCOUNTER — Telehealth: Payer: Self-pay

## 2024-01-22 ENCOUNTER — Other Ambulatory Visit (HOSPITAL_COMMUNITY): Payer: Self-pay

## 2024-01-22 DIAGNOSIS — Z5181 Encounter for therapeutic drug level monitoring: Secondary | ICD-10-CM

## 2024-01-22 DIAGNOSIS — J849 Interstitial pulmonary disease, unspecified: Secondary | ICD-10-CM

## 2024-01-22 NOTE — Telephone Encounter (Signed)
 Copied from CRM [id number]. Topic: Clinical - Prescription Issue >> Jan 21, 2024  9:32 AM Gordon Love wrote: Reason for CRM: Patient wife Gordon Love called in regarding a medication for the patient. Gordon Love stated she did not know the name of the medication but Dr.Mannam advised the patient start taking the medication and was advised by Dr.Mannam the medication would come in the mail and someone would call them regarding the medication and no one has gotten in contact with them and they are unsure where the medication is coming from. Gordon Love requested a callback directly from Dr.Mannam advised her a follow up call would be made once clinical staff spoke with Dr.Mannam. >> Jan 22, 2024 12:37 PM Gordon Love wrote: Patients daguhter Gordon Love is requesting an update as the patients hematologist needs to know when patient will began taking Ofev.

## 2024-01-22 NOTE — Telephone Encounter (Signed)
 Received notification via patient call that pt was supposed to be Ofev new start at last appt 8/13. Submitted a Prior Authorization request to [employer] for OFEV via CoverMyMeds. Will update once we receive a response.  Key: [id number]

## 2024-01-25 ENCOUNTER — Other Ambulatory Visit (HOSPITAL_COMMUNITY): Payer: Self-pay

## 2024-01-25 MED ORDER — OFEV 150 MG PO CAPS: 150.0000 mg | ORAL_CAPSULE | Freq: Two times a day (BID) | ORAL | 0 refills | Status: DC

## 2024-01-25 NOTE — Telephone Encounter (Signed)
 ATC patient to discuss Ofev new start counseling. LVMTCB.   Aleck Puls, PharmD, BCPS Clinical Pharmacist  [facility]

## 2024-01-25 NOTE — Telephone Encounter (Signed)
 Pt returning your call.  Called office and they sent message to you w/ no response.   I advised pt you are w/ a pt and would need to call him back.  Pt very understanding.

## 2024-01-25 NOTE — Telephone Encounter (Signed)
 Received notification from [employer] regarding a prior authorization for OFEV. Authorization has been APPROVED from 04/29/23 to 04/27/24. Approval letter sent to scan center.  Per test claim, copay for 30 days supply is $943.54  Authorization # [id number] Phone # [phone]  Will proceed with grant application

## 2024-01-25 NOTE — Telephone Encounter (Signed)
 Last OV with Dr. Theophilus 12/09/23. At that time, plan to initiate Ofev twice daily. 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  Called patient to discuss Ofev new start counseling - patient and spouse are present for call.  Patient counseled on purpose, proper use, and potential adverse effects including diarrhea, nausea, vomiting, abdominal pain, decreased appetite, weight loss, and increased blood pressure. Stressed the importance of routine lab monitoring. Will monitor LFT's every month for the first 6 months of treatment then every 3 months.   Ofev dose will be 150 mg capsule every 12 hours with food. Stressed importance of taking with food to minimize stomach upset. He routinely has morning and evening meals. Will continue this routine. Discussed importance of maintaining protein intake.  Current medication list reviewed - no drug interactions with current medications noted.  Plan: - Rx sent to [facility]. [phone]. Contact provided to patient/spouse. - Patient will call pharmacy if they do not receive call from pharmacy in the next few days to coordinate shipment.  - Patient will go to [facility] for monthly labs for 6 months (October 2025 to March 2026). Patient will message PharmD through MyChart so that orders can be released within 5 days of when he plans to go to lab. Standing orders for hepatic function panel entered today. - PharmD will message with grant information in case pharmacy does not receive grant information from team when Rx is sent electronically.  - Pharmacy team contact information provided. Patient will reach out with questions or concerns.   Aleck Puls, PharmD, BCPS Clinical Pharmacist  [facility]

## 2024-01-25 NOTE — Telephone Encounter (Signed)
 Pt enrolled in IPF grant through [employer]:  Amount: $4000 Award Period: 07/29/23-01/24/25 BIN: [id number] PCN: [id number] Group: [id number] ID: [id number]  For pharmacy inquiries, contact PDMI at [phone]. For patient inquiries, contact PA[employer] at [phone].

## 2024-01-25 NOTE — Telephone Encounter (Signed)
 Pt daughter notified on VM that pharmacy team will reach out them once approval is done

## 2024-01-28 ENCOUNTER — Other Ambulatory Visit: Payer: Self-pay

## 2024-01-28 MED ORDER — OFEV 150 MG PO CAPS: 150.0000 mg | ORAL_CAPSULE | Freq: Two times a day (BID) | ORAL | 0 refills | Status: DC

## 2024-01-28 NOTE — Addendum Note (Signed)
 Addended by: Karene Bracken L on: 01/28/2024 03:56 PM   Modules accepted: Orders

## 2024-02-03 MED ORDER — OFEV 150 MG PO CAPS: 150.0000 mg | ORAL_CAPSULE | Freq: Two times a day (BID) | ORAL | 0 refills | Status: DC

## 2024-02-03 NOTE — Addendum Note (Signed)
 Addended by: Aroush Chasse L on: 02/03/2024 03:25 PM   Modules accepted: Orders

## 2024-02-03 NOTE — Telephone Encounter (Signed)
 Received VM from daughter - pharmacy has not received Rx.   Called pharmacy and provided verbal order [facility]) and gave grant information.   Called patient to alert him - if he doesn't hear from pharmacy in the next couple days, call [facility].   He will call us  if any questions/concerns.   Aleck Puls, PharmD, BCPS, CPP Clinical Pharmacist  [facility]

## 2024-02-25 ENCOUNTER — Other Ambulatory Visit: Payer: Self-pay

## 2024-02-25 DIAGNOSIS — Z5181 Encounter for therapeutic drug level monitoring: Secondary | ICD-10-CM

## 2024-02-25 NOTE — Addendum Note (Signed)
 Addended by: Angelita Harnack L on: 02/25/2024 02:03 PM   Modules accepted: Orders

## 2024-02-26 NOTE — Telephone Encounter (Signed)
 Called patient to ensure he saw MyChart message. Started Ofev on 02/11/24. He will respond to MyChart message when he plans to get labs drawn at [facility].

## 2024-03-02 NOTE — Telephone Encounter (Signed)
 Patient called and asked if he needs to go to [facility] before appointment with Dr. Theophilus on 03/10/24. Advised that he can get labs drawn the same day as his appointment with Dr. Theophilus on 03/10/24. He does not need to go to [facility] for labs prior to his upcoming appointment.

## 2024-03-10 ENCOUNTER — Ambulatory Visit: Admitting: Pulmonary Disease

## 2024-03-10 VITALS — BP 134/82 | HR 70 | Temp 97.4°F | Ht 67.0 in | Wt 170.0 lb

## 2024-03-10 DIAGNOSIS — R0902 Hypoxemia: Secondary | ICD-10-CM

## 2024-03-10 DIAGNOSIS — Z5181 Encounter for therapeutic drug level monitoring: Secondary | ICD-10-CM

## 2024-03-10 DIAGNOSIS — K8020 Calculus of gallbladder without cholecystitis without obstruction: Secondary | ICD-10-CM | POA: Diagnosis not present

## 2024-03-10 DIAGNOSIS — R931 Abnormal findings on diagnostic imaging of heart and coronary circulation: Secondary | ICD-10-CM | POA: Diagnosis not present

## 2024-03-10 DIAGNOSIS — J849 Interstitial pulmonary disease, unspecified: Secondary | ICD-10-CM | POA: Diagnosis not present

## 2024-03-10 DIAGNOSIS — Z87891 Personal history of nicotine dependence: Secondary | ICD-10-CM

## 2024-03-10 LAB — HEPATIC FUNCTION PANEL
ALT: 60 U/L — ABNORMAL HIGH (ref 0–53)
AST: 26 U/L (ref 0–37)
Albumin: 4.1 g/dL (ref 3.5–5.2)
Alkaline Phosphatase: 71 U/L (ref 39–117)
Bilirubin, Direct: 0.1 mg/dL (ref 0.0–0.3)
Total Bilirubin: 0.8 mg/dL (ref 0.2–1.2)
Total Protein: 7.5 g/dL (ref 6.0–8.3)

## 2024-03-10 NOTE — Progress Notes (Signed)
 Arnett Duddy    [id number]    [birth year]  Primary Care Physician:Patient, No Pcp Per  Referring Physician: No referring provider defined for this encounter.  Chief complaint:  Follow-up for interstitial lung disease Started nintedanib August 2025  HPI: 83 y.o. who  has a past medical history of Cancer [facility]), History of prostate cancer, Hyperlipidemia, and Myeloproliferative disorder (HCC).   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  Chest crackles were noted on examination and subsequent chest x-ray showed possible interstitial changes.  He has been referred here for further evaluation  Discussed at multidisciplinary conference in March 2024 with confirmation of alternate pattern.  Recommendation made for bronchoscopy with BAL and Envisia versus surgical lung biopsy but patient did not want to go through with procedure.  Due to progression of symptoms decision made to treat him with nintedanib in August 2025 for progressive pulmonary fibrosis and he was started on supplemental oxygen at the same time. He was trialed on Trelegy in 2025 but did not note any improvement and inhaler was discontinued.  Interim history: Discussed the use of AI scribe software for clinical note transcription with the patient, who gave verbal consent to proceed. Office History of Present Illness Gordon Love is an 83 year old male with interstitial lung disease who presents for follow-up of his condition and medication management.  Progressive dyspnea and functional status - Exertional dyspnea with ability to walk across the yard - Dyspnea relieved within two minutes by inhaler use  Pulmonary fibrosis and interstitial lung disease management - Pulmonary fibrosis with progressive worsening - Ofev (nintedanib) therapy to slow disease progression - No common side effects from Ofev, including no diarrhea - Regular  liver function tests to monitor for medication-related hepatotoxicity  Oxygen therapy - Nocturnal oxygen therapy - Daytime oxygen use as needed  Relevant Pulmonary History Pets: No pets Occupation: Used to work as a production designer, 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  No feather pillows or comforters ILD questionnaire 04/01/2022- Negative Smoking history: 10-pack-year smoker.  Quit in 1980 Travel history: Lives in [place] .  No significant travel Relevant family history: No family history of lung disease.  Outpatient Encounter Medications as of 03/10/2024  Medication Sig   albuterol  (VENTOLIN  HFA) 108 (90 Base) MCG/ACT inhaler Inhale 2 puffs into the lungs every 6 (six) hours as needed for wheezing or shortness of breath.   aspirin EC 81 MG tablet Take 81 mg by mouth daily. Swallow whole.   atorvastatin  (LIPITOR) 20 MG tablet Take 1 tablet (20 mg total) by mouth daily.   hydroxyurea  (HYDREA ) 500 MG capsule Take 2 capsules (1,000 mg total) by mouth daily. May take with food to minimize GI side effects.   Nintedanib (OFEV) 150 MG CAPS Take 1 capsule (150 mg total) by mouth 2 (two) times daily. Repeat labs in 1 month.   No facility-administered encounter medications on file as of 03/10/2024.   Vitals:   03/10/24 1120 03/10/24 1128  BP: 135/77 134/82  Pulse: 70   Temp: (!) 97.4 F (36.3 C)   Height: 5' 7 (1.702 m)   Weight: 170 lb (77.1 kg)   SpO2: 93%   TempSrc: Oral   BMI (Calculated): 26.62      Physical Exam GEN: No acute distress. CV: Regular rate and rhythm, no murmurs. LUNGS: Crackles present on auscultation, normal respiratory effort. SKIN JOINTS: Warm  and dry, no rash.    Data Reviewed: Imaging: CT abdomen pelvis 10/09/2018-atelectasis at the lung base CTA 11/01/2018- atelectasis at the lung base Chest x-ray 03/07/2022-chronic interstitial prominence, mild basilar predominance. High resolution CT 05/01/22-subpleural and  peribronchovascular reticular opacities with upper lobe predominance.  Suggestive of alternative diagnosis.  4 mm right upper lobe lung nodule. High-resolution CT 05/18/2023-stable to minimally changed interstitial lung disease in alternate pattern High-resolution CT 10/28/2023-stable interstitial changes in alternate pattern I have reviewed the images personally.  PFTs: 05/13/2022 FVC 2.49 [72%], FEV1 2.09 [86%], F/F84, TLC 4.89 [78%], DLCO 13.20 [61%] Minimal restriction, moderate diffusion defect  12/31/2022 FVC 2.30 [67], FEV1 1.95 [82%], F/F84 TLC 4.23 (67%], DLCO 11.66 (54%] Mild restriction, moderate diffusion defect  11/12/2023 FVC 2.20 [6 6%], FEV1 1.78 [76%], F/F81, TLC 3.16 [50%], DLCO 9.06 [42%] Moderate restriction, severe diffusion defect.  Worsening compared to 2024  Labs: ILD panel 04/01/2022-positive for ANA 1: 40, nuclear speckled Hypersensitivity panel 06/01/2022- negative  Cardiac: Echocardiogram 12/04/2023-LVEF 55-60%, grade 1 diastolic dysfunction, normal RV systolic function with mild enlargement.  TR is inadequate for measuring PA pressures.  TAPSE 1.9 cm Assessment & Plan Progressive interstitial lung disease (pulmonary fibrosis) CT scan suggestive of interstitial changes in alternate pattern He does not have significant exposures or signs and symptoms of connective tissue disease.  ANA is borderline elevated which is likely nonspecific, hypersensitivity panel is negative. 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 CT scan shows stable disease, but clinical symptoms are worsening. Differential diagnosis is unclear, and biopsy is not recommended due to  age-related risks and potential lack of diagnostic yield. Shared decision to start nintedanib, a twice-daily medication, with monitoring for side effects and liver function.  He is tolerating therapy well so far.  Exercise therapy and pulmonary rehabilitation recommended to improve symptoms.  - Continue nintedanib therapy. - Ordered liver function tests to monitor for adverse effects of nintedanib. - Referred to pulmonary rehabilitation program at [facility] or local facility in [place]. - Continue oxygen therapy at night and as needed during the day. - Use inhaler as needed for exertional dyspnea.  Monitoring for adverse effects of nintedanib therapy Monitoring required due to potential hepatotoxicity. No side effects reported thus far. - Ordered liver function tests to monitor for hepatotoxicity.  Mild right heart enlargement Mild enlargement of the right side of the heart noted on echocardiogram though TR is trivial and TAPSE is normal. No significant cardiac symptoms reported. Monitoring is advised due to potential strain from lung disease and element of pulmonary hypertension - Monitor cardiac function  Plan/Recommendations: Continue nintedanib, supplemental oxygen Referral to pulmonary rehab Check liver panel for therapeutic monitoring  I personally spent a total of 30 minutes in the care of the patient today including preparing to see the patient, getting/reviewing separately obtained history, performing a medically appropriate exam/evaluation, documenting clinical information in the EHR, independently interpreting results, communicating results, and coordinating care.   Lonna Coder MD  Pulmonary and Critical Care 03/10/2024, 11:31 AM  CC: No ref. provider found

## 2024-03-10 NOTE — Progress Notes (Signed)
 Gordon Love    [id number]    Aug 24, 1940  Primary Care Physician:Patient, No Pcp Per  Referring Physician: No referring provider defined for this encounter.  Chief complaint: Follow-up for interstitial lung disease  HPI: 83 y.o. who  has a past medical history of Cancer (HCC), History of prostate cancer, Hyperlipidemia, and Myeloproliferative disorder (HCC).   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  Chest crackles were noted on examination and subsequent chest x-ray showed possible interstitial changes.  He has been referred here for further evaluation  Discussed at multidisciplinary conference in March 2024 with confirmation of alternate pattern.  Recommendation made for bronchoscopy with BAL and Envisia versus surgical lung biopsy but patient did not want to go through with procedure  Interim history: Discussed the use of AI scribe software for clinical note transcription with the patient, who gave verbal consent to proceed.  History of Present Illness Gordon Love is an 83 year old male with interstitial lung disease who presents with worsening shortness of breath. He is accompanied by his wife, daughter, and son.   Over the past two months, he has experienced worsening shortness of breath, particularly when walking from one room to another. The symptoms began suddenly around the time of increased pollen levels, initially thought to be related to allergies. He tried taking Zyrtec with minimal relief.  He experiences significant shortness of breath when walking outside or performing activities such as gardening. He can mow the grass while sitting down, but walking any distance causes him to struggle for breath. No cough, sputum production, or wheezing. Oxygen saturation was noted to be 92% during the visit.  Last year, he was evaluated for ILD. A follow-up CT scan in January showed stable  or minimally changed findings. He recently underwent a chemical cardiac stress test, which was normal, and his heart was evaluated and found to be fine.   Interim history: Gordon Love is an 83 year old male with interstitial lung disease who presents with worsening breathing issues.  Dyspnea and hypoxemia - Worsening breathing difficulties over time - Episodes of low oxygen saturation, with levels dropping to 87% upon arrival at the clinic - Cyanosis of the lips (bluish-gray discoloration) noted on Thursday - Symptoms worsen with exertion and during sleep - No significant relief from inhaler use - Frequently experiences fatigue and malaise  Pulmonary function and imaging - History of interstitial lung disease monitored with CT scans and lung function tests - Recent CT scans stable - Lung function tests demonstrate decline - Not currently on supplemental oxygen therapy  Medication use and tolerability - Current regimen includes hydroxyurea  twice daily - Concern for potential gastrointestinal upset from hydroxyurea , possibly exacerbated by new medications under consideration for lung disease - Discussion regarding cost and necessity of inhalers and Trelegy, as his wife uses these medications  Relevant Pulmonary History Pets: No pets Occupation: Used to work as a production designer, 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  No feather pillows or comforters ILD questionnaire 04/01/2022- Negative Smoking history: 10-pack-year smoker.  Quit in 1980 Travel history: Lives in [place] .  No significant travel Relevant family history: No family history of lung disease.  Outpatient Encounter Medications as of 03/10/2024  Medication Sig   albuterol  (VENTOLIN  HFA) 108 (90 Base) MCG/ACT inhaler Inhale 2 puffs into the lungs every 6 (six) hours as needed for  wheezing or shortness of breath.   aspirin EC 81 MG tablet Take 81 mg by mouth daily. Swallow whole.    atorvastatin  (LIPITOR) 20 MG tablet Take 1 tablet (20 mg total) by mouth daily.   hydroxyurea  (HYDREA ) 500 MG capsule Take 2 capsules (1,000 mg total) by mouth daily. May take with food to minimize GI side effects.   Nintedanib (OFEV) 150 MG CAPS Take 1 capsule (150 mg total) by mouth 2 (two) times daily. Repeat labs in 1 month.   No facility-administered encounter medications on file as of 03/10/2024.   Vitals:   03/10/24 1120 03/10/24 1128  BP: 135/77 134/82  Pulse: 70   Temp: (!) 97.4 F (36.3 C)   Height: 5' 7 (1.702 m)   Weight: 170 lb (77.1 kg)   SpO2: 93%   TempSrc: Oral   BMI (Calculated): 26.62      Physical Exam GEN: No acute distress. CV: Regular rate and rhythm, no murmurs. LUNGS: Crackles heard on auscultation due to scarring. SKIN JOINTS: Warm and dry, no rash.    Data Reviewed: Imaging: CT abdomen pelvis 10/09/2018-atelectasis at the lung base CTA 11/01/2018- atelectasis at the lung base Chest x-ray 03/07/2022-chronic interstitial prominence, mild basilar predominance. High resolution CT 05/01/22-subpleural and peribronchovascular reticular opacities with upper lobe predominance.  Suggestive of alternative diagnosis.  4 mm right upper lobe lung nodule. High-resolution CT 05/18/2023-stable to minimally changed interstitial lung disease in alternate pattern High-resolution CT 10/28/2023-stable interstitial changes in alternate pattern I have reviewed the images personally.  PFTs: 05/13/2022 FVC 2.49 [72%], FEV1 2.09 [86%], F/F84, TLC 4.89 [78%], DLCO 13.20 [61%] Minimal restriction, moderate diffusion defect  12/31/2022 FVC 2.30 [67], FEV1 1.95 [82%], F/F84 TLC 4.23 (67%], DLCO 11.66 (54%] Mild restriction, moderate diffusion defect  11/12/2023 FVC 2.20 [6 6%], FEV1 1.78 [76%], F/F81, TLC 3.16 [50%], DLCO 9.06 [42%] Moderate restriction, severe diffusion defect.  Worsening compared to 2024  Labs: ILD panel 04/01/2022-positive for ANA 1: 40, nuclear  speckled Hypersensitivity panel 06/01/2022- negative  Cardiac: Echocardiogram 12/04/2023-LVEF 55-60%, grade 1 diastolic dysfunction, normal RV systolic function with mild enlargement.  TR is inadequate for measuring PA pressures.  TAPSE 1.9 cm Assessment & Plan Interstitial lung disease He has some crackles on examination and CT scan suggestive of interstitial changes in alternate pattern He does not have significant exposures or signs and symptoms of connective tissue disease.  ANA is borderline elevated which is likely nonspecific, hypersensitivity panel is negative. 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 CT scan shows stable disease, but clinical symptoms are worsening. Differential diagnosis is unclear, and biopsy is not recommended due to age-related risks and potential lack of diagnostic yield. Discussion of medication options to slow disease progression, including nintedanib and pirfenidone. Both medications can slow disease progression but are not curative. Risks include liver function impairment and gastrointestinal side effects such as nausea, indigestion, and diarrhea. Shared decision to start nintedanib, a twice-daily medication, with monitoring for side effects and liver function. - Start nintedanib twice daily - Monitor liver function monthly - Discontinue Trelegy inhaler as it is not effective - Order albuterol  rescue inhaler - Order baseline liver function tests - Initiate insurance approval process for nintedanib  Hypoxemia Hypoxemia with episodes of oxygen saturation  dropping to 87%, accompanied by cyanosis and fatigue. Symptoms worsen with exertion and during  sleep. Oxygen therapy is considered to alleviate symptoms and improve quality of life. Discussion of oxygen delivery methods, including portable and tank options, based on oxygen requirements.  DME: [facility] , [place]  SATURATION QUALIFICATIONS: (This note is used to comply with regulatory documentation for home oxygen)  Patient Saturations on Room Air at Rest = 92%  Patient Saturations on Room Air while Ambulating = 85%  Patient Saturations on 2 Liters of oxygen while Ambulating = 97%  Please briefly explain why patient needs home oxygen:patient did not walk very far before O2 dropped to 85 %, placed on 2/3 L of O2 @ 97%    Gallstones without cholecystitis or obstruction Incidental finding of gallstones on CT scan without signs of cholecystitis or obstruction. No current symptoms or complications from gallstones. Conservative management is recommended due to lack of symptoms and potential surgical risks. - Monitor for symptoms of gallstone complications  Mild right heart enlargement Mild enlargement of the right side of the heart noted on echocardiogram. No significant cardiac symptoms reported. Monitoring is advised due to potential strain from lung disease and element of pulmonary hypertension - Monitor cardiac function  Plan/Recommendations: Initiate Ofev, monitor liver function Discontinue Trelegy Start supplemental oxygen   Orhan Mayorga MD [place] [facility] 03/10/2024, 11:33 AM  CC: No ref. provider found

## 2024-03-10 NOTE — Patient Instructions (Signed)
  VISIT SUMMARY: You had a follow-up appointment to manage your interstitial lung disease and review your current medications. We discussed your symptoms, including shortness of breath with exertion, and your current treatments, including oxygen therapy and nintedanib.  YOUR PLAN: PROGRESSIVE INTERSTITIAL LUNG DISEASE (PULMONARY FIBROSIS): You have progressive interstitial lung disease with pulmonary fibrosis, which causes shortness of breath, especially with exertion. -Continue taking nintedanib as prescribed to slow the progression of the disease. -We have ordered liver function tests to monitor for any potential side effects from nintedanib. -You are referred to a pulmonary rehabilitation program at [facility] or a local facility in [place] to help improve your symptoms. -Continue using oxygen therapy at night and as needed during the day. -Use your inhaler as needed for shortness of breath with exertion.  MONITORING FOR ADVERSE EFFECTS OF NINTEDANIB THERAPY: We need to monitor for potential liver-related side effects from nintedanib. -We have ordered liver function tests to check for any liver issues. -We will coordinate with Aleck our pharmacist to review your lab results and decide on the continuation of your medication.

## 2024-03-14 NOTE — Telephone Encounter (Signed)
 Called patient to discuss -   [facility] tried to reach patient and left voicemails, he tried returning the call but did not get through. Provided alternative number for [facility]: [phone].   He ran out of Ofev about 3 days ago. Plan to restart medication when he receives supply from [facility].   Repeat LFTs in one month - around week of December 7th. [facility] orders entered d/t this is more convenient for patient.   Patient verbalizes understanding and agreement with plan.

## 2024-03-15 ENCOUNTER — Telehealth (HOSPITAL_COMMUNITY): Payer: Self-pay

## 2024-03-15 NOTE — Telephone Encounter (Signed)
 Faxed pt referral to [facility].

## 2024-03-16 ENCOUNTER — Telehealth: Payer: Self-pay | Admitting: Pulmonary Disease

## 2024-03-16 NOTE — Telephone Encounter (Signed)
 Copied from CRM [id number]. Topic: Referral - Status >> Mar 16, 2024  2:12 PM Russell PARAS wrote: Reason for CRM:   Nurse with Cardiac Rehab at [facility] is contacting clinic to advise they received a referral for [facility]; however, they do not offer these services at this location. Referral will need to be sent to alternate clinic. NFN

## 2024-03-17 ENCOUNTER — Ambulatory Visit: Payer: Self-pay | Admitting: Pulmonary Disease

## 2024-03-17 DIAGNOSIS — J849 Interstitial pulmonary disease, unspecified: Secondary | ICD-10-CM

## 2024-03-17 DIAGNOSIS — Z5181 Encounter for therapeutic drug level monitoring: Secondary | ICD-10-CM

## 2024-03-17 NOTE — Telephone Encounter (Signed)
 Copied from CRM [id number]. Topic: Referral - Status >> Mar 16, 2024  2:12 PM Russell PARAS wrote: Reason for CRM:    Nurse with Cardiac Rehab at [facility] is contacting clinic to advise they received a referral for [facility]; however, they do not offer these services at this location. Referral will need to be sent to alternate clinic. NFN  Please advice

## 2024-03-18 NOTE — Telephone Encounter (Signed)
 Can we find if there are any other facilities at [place], [place] offering pulmonary rehab.  If there are none then please make a referral for pulmonary rehab at [facility] or [facility]

## 2024-04-06 ENCOUNTER — Other Ambulatory Visit: Payer: Self-pay

## 2024-04-06 DIAGNOSIS — Z5181 Encounter for therapeutic drug level monitoring: Secondary | ICD-10-CM

## 2024-04-07 ENCOUNTER — Ambulatory Visit: Payer: Self-pay

## 2024-04-07 LAB — HEPATIC FUNCTION PANEL
ALT: 82 IU/L — ABNORMAL HIGH (ref 0–44)
AST: 39 IU/L (ref 0–40)
Albumin: 4 g/dL (ref 3.7–4.7)
Alkaline Phosphatase: 87 IU/L (ref 48–129)
Bilirubin Total: 0.7 mg/dL (ref 0.0–1.2)
Bilirubin, Direct: 0.26 mg/dL (ref 0.00–0.40)
Total Protein: 7.1 g/dL (ref 6.0–8.5)

## 2024-04-13 ENCOUNTER — Other Ambulatory Visit: Payer: Self-pay

## 2024-04-13 DIAGNOSIS — J849 Interstitial pulmonary disease, unspecified: Secondary | ICD-10-CM

## 2024-04-13 MED ORDER — OFEV 150 MG PO CAPS: 150.0000 mg | ORAL_CAPSULE | Freq: Two times a day (BID) | ORAL | 3 refills | Status: AC

## 2024-04-13 NOTE — Progress Notes (Signed)
 Patient called me because he received refill request from [facility] and was unsure if he should fill Ofev .   Last LFTs 04/06/24 with ALT slightly elevated and increased from previous 1 month ago.  Other liver function enzymes are wnl and stable.  Will repeat labs the week of 05/02/24. Lab orders are in.  Renewed Rx for Ofev , triaged to [facility].

## 2024-04-25 ENCOUNTER — Telehealth: Payer: Self-pay

## 2024-04-25 ENCOUNTER — Other Ambulatory Visit: Payer: Self-pay

## 2024-04-25 DIAGNOSIS — Z5181 Encounter for therapeutic drug level monitoring: Secondary | ICD-10-CM

## 2024-04-25 NOTE — Telephone Encounter (Signed)
 Patient called because Ofev  has not arrived in the mail. Reports his daughter spoke with [facility] and they were advised the Rx would be arriving soon, but it has not arrived yet. Advised patient/family to contact [facility] to find out the estimated arrival date. Rx with refills was sent to pharmacy on 04/13/24, and records show last filled 04/15/24.   He reports he plans to go to [facility] next week - released LFT orders.

## 2024-05-02 NOTE — Addendum Note (Signed)
 Addended by: Albirda Shiel L on: 05/02/2024 11:47 AM   Modules accepted: Orders

## 2024-05-04 ENCOUNTER — Other Ambulatory Visit: Payer: Self-pay

## 2024-05-04 DIAGNOSIS — Z5181 Encounter for therapeutic drug level monitoring: Secondary | ICD-10-CM

## 2024-05-06 LAB — HEPATIC FUNCTION PANEL
ALT: 27 IU/L (ref 0–44)
AST: 15 IU/L (ref 0–40)
Albumin: 3.9 g/dL (ref 3.7–4.7)
Alkaline Phosphatase: 72 IU/L (ref 48–129)
Bilirubin Total: 0.7 mg/dL (ref 0.0–1.2)
Bilirubin, Direct: 0.22 mg/dL (ref 0.00–0.40)
Total Protein: 7 g/dL (ref 6.0–8.5)

## 2024-05-09 ENCOUNTER — Telehealth: Payer: Self-pay

## 2024-05-09 ENCOUNTER — Other Ambulatory Visit: Payer: Self-pay | Admitting: Hematology and Oncology

## 2024-05-09 NOTE — Telephone Encounter (Signed)
 Called and left a message regarding refill request for Lipitor. Per Dr. Lonn, Can you ask if he has established care with PCP?  If not, he should ask for refill from his cardiologist.  Ask him to call the office back.

## 2024-05-09 NOTE — Telephone Encounter (Signed)
 Returned call to daughter. They will request Rx from cardiology.

## 2024-05-11 ENCOUNTER — Ambulatory Visit: Payer: Self-pay

## 2024-05-11 ENCOUNTER — Other Ambulatory Visit: Payer: Self-pay | Admitting: *Deleted

## 2024-05-11 MED ORDER — ATORVASTATIN CALCIUM 20 MG PO TABS: 20.0000 mg | ORAL_TABLET | Freq: Every day | ORAL | 0 refills | Status: AC

## 2024-05-23 ENCOUNTER — Ambulatory Visit: Payer: Self-pay

## 2024-07-18 ENCOUNTER — Other Ambulatory Visit

## 2024-07-18 ENCOUNTER — Ambulatory Visit: Admitting: Hematology and Oncology

## 2024-09-06 ENCOUNTER — Ambulatory Visit: Admitting: Pulmonary Disease

## 2024-10-12 ENCOUNTER — Ambulatory Visit: Payer: Self-pay
